# Patient Record
Sex: Female | Born: 1955 | Race: White | Hispanic: No | Smoking: Former smoker
Health system: Southern US, Community
[De-identification: ages and names within clinical notes are randomized; demographics above are authoritative.]

## PROBLEM LIST (undated history)

## (undated) DIAGNOSIS — E079 Disorder of thyroid, unspecified: Secondary | ICD-10-CM

## (undated) DIAGNOSIS — I82409 Acute embolism and thrombosis of unspecified deep veins of unspecified lower extremity: Secondary | ICD-10-CM

## (undated) DIAGNOSIS — E785 Hyperlipidemia, unspecified: Secondary | ICD-10-CM

## (undated) DIAGNOSIS — R011 Cardiac murmur, unspecified: Secondary | ICD-10-CM

## (undated) DIAGNOSIS — H269 Unspecified cataract: Secondary | ICD-10-CM

## (undated) DIAGNOSIS — E039 Hypothyroidism, unspecified: Secondary | ICD-10-CM

## (undated) DIAGNOSIS — K589 Irritable bowel syndrome without diarrhea: Secondary | ICD-10-CM

## (undated) DIAGNOSIS — F419 Anxiety disorder, unspecified: Secondary | ICD-10-CM

## (undated) DIAGNOSIS — M1990 Unspecified osteoarthritis, unspecified site: Secondary | ICD-10-CM

## (undated) DIAGNOSIS — T7840XA Allergy, unspecified, initial encounter: Secondary | ICD-10-CM

## (undated) HISTORY — DX: Unspecified cataract: H26.9

## (undated) HISTORY — PX: TOE SURGERY: SHX1073

## (undated) HISTORY — PX: TRIGGER FINGER RELEASE: SHX641

## (undated) HISTORY — PX: JOINT REPLACEMENT: SHX530

## (undated) HISTORY — PX: HERNIA REPAIR: SHX51

## (undated) HISTORY — PX: TUBAL LIGATION: SHX77

## (undated) HISTORY — PX: COLON SURGERY: SHX602

## (undated) HISTORY — DX: Allergy, unspecified, initial encounter: T78.40XA

## (undated) HISTORY — PX: CARPAL TUNNEL WITH CUBITAL TUNNEL: SHX5608

## (undated) HISTORY — PX: SPINE SURGERY: SHX786

## (undated) HISTORY — DX: Irritable bowel syndrome, unspecified: K58.9

## (undated) HISTORY — PX: AUGMENTATION MAMMAPLASTY: SUR837

## (undated) HISTORY — PX: HARDWARE REMOVAL: SHX979

## (undated) HISTORY — DX: Anxiety disorder, unspecified: F41.9

## (undated) HISTORY — DX: Hyperlipidemia, unspecified: E78.5

## (undated) HISTORY — DX: Disorder of thyroid, unspecified: E07.9

## (undated) HISTORY — PX: DE QUERVAIN'S RELEASE: SHX1439

---
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
## 1974-04-06 HISTORY — PX: PARTIAL HYSTERECTOMY: SHX80

## 1981-04-06 HISTORY — PX: ABDOMINAL HYSTERECTOMY: SHX81

## 2000-04-06 HISTORY — PX: BACK SURGERY: SHX140

## 2003-04-07 HISTORY — PX: BREAST ENHANCEMENT SURGERY: SHX7

## 2007-04-07 HISTORY — PX: REPLACEMENT TOTAL KNEE: SUR1224

## 2008-04-06 HISTORY — PX: CARPAL TUNNEL RELEASE: SHX101

## 2010-04-06 HISTORY — PX: GANGLION CYST EXCISION: SHX1691

## 2011-04-07 HISTORY — PX: ELBOW SURGERY: SHX618

## 2013-09-04 LAB — HM MAMMOGRAPHY

## 2013-11-16 LAB — LIPID PANEL
CHOLESTEROL: 254 mg/dL — AB (ref 0–200)
HDL: 69 mg/dL (ref 35–70)
LDL Cholesterol: 167 mg/dL
Triglycerides: 86 mg/dL (ref 40–160)

## 2013-11-16 LAB — CBC AND DIFFERENTIAL
HCT: 46 % (ref 36–46)
Hemoglobin: 14.6 g/dL (ref 12.0–16.0)
Platelets: 301 10*3/uL (ref 150–399)
WBC: 5.6 10^3/mL

## 2013-11-16 LAB — TSH: TSH: 21.35 u[IU]/mL — AB (ref 0.41–5.90)

## 2013-12-13 LAB — BASIC METABOLIC PANEL
BUN: 11 mg/dL (ref 4–21)
Creatinine: 1 mg/dL (ref 0.5–1.1)
Glucose: 89 mg/dL
POTASSIUM: 3.7 mmol/L (ref 3.4–5.3)
Sodium: 134 mmol/L — AB (ref 137–147)

## 2013-12-13 LAB — TSH: TSH: 2.87 u[IU]/mL (ref 0.41–5.90)

## 2013-12-13 LAB — HEPATIC FUNCTION PANEL
ALT: 31 U/L (ref 7–35)
AST: 26 U/L (ref 13–35)
Alkaline Phosphatase: 78 U/L (ref 25–125)

## 2014-01-05 LAB — TSH: TSH: 0.93 u[IU]/mL (ref 0.41–5.90)

## 2014-03-08 LAB — TSH: TSH: 16.02 u[IU]/mL — AB (ref 0.41–5.90)

## 2014-04-09 ENCOUNTER — Encounter: Payer: Self-pay | Admitting: Physician Assistant

## 2014-04-09 ENCOUNTER — Ambulatory Visit (INDEPENDENT_AMBULATORY_CARE_PROVIDER_SITE_OTHER): Payer: Self-pay | Admitting: Physician Assistant

## 2014-04-09 VITALS — BP 121/76 | HR 55 | Ht 63.5 in | Wt 132.0 lb

## 2014-04-09 DIAGNOSIS — E039 Hypothyroidism, unspecified: Secondary | ICD-10-CM

## 2014-04-09 DIAGNOSIS — M5126 Other intervertebral disc displacement, lumbar region: Secondary | ICD-10-CM

## 2014-04-09 DIAGNOSIS — M5442 Lumbago with sciatica, left side: Secondary | ICD-10-CM

## 2014-04-09 DIAGNOSIS — M5416 Radiculopathy, lumbar region: Secondary | ICD-10-CM

## 2014-04-09 MED ORDER — MELOXICAM 15 MG PO TABS: 15.0000 mg | ORAL_TABLET | Freq: Every day | ORAL | Status: DC

## 2014-04-09 MED ORDER — PREDNISONE 50 MG PO TABS: ORAL_TABLET | ORAL | Status: DC

## 2014-04-09 MED ORDER — LEVOTHYROXINE SODIUM 75 MCG PO CAPS: 75.0000 ug | ORAL_CAPSULE | Freq: Every day | ORAL | Status: DC

## 2014-04-09 MED ORDER — KETOROLAC TROMETHAMINE 30 MG/ML IJ SOLN
30.0000 mg | Freq: Once | INTRAMUSCULAR | Status: AC
  Administered 2014-04-09: 30 mg via INTRAMUSCULAR

## 2014-04-09 NOTE — Progress Notes (Addendum)
   Subjective:    Patient ID: Joy Golden, female    DOB: 1955/05/19, 59 y.o.   MRN: [id number]  HPI  Pt is a 59 yo female who presents to the clinic to establish care.   .. Active Ambulatory Problems    Diagnosis Date Noted  . Thyroid activity decreased 04/11/2014  . Lumbar radiculopathy 04/11/2014   Resolved Ambulatory Problems    Diagnosis Date Noted  . No Resolved Ambulatory Problems   Past Medical History  Diagnosis Date  . Thyroid disease    .Marland Kitchen Family History  Problem Relation Age of Onset  . Alzheimer's disease Mother   . Cancer Sister     ovarian  . Tuberculosis Sister   . Diabetes Maternal Grandmother   . Cancer Sister     ovarian   .Marland Kitchen History   Social History  . Marital Status: Divorced    Spouse Name: N/A    Number of Children: N/A  . Years of Education: N/A   Occupational History  . Not on file.   Social History Main Topics  . Smoking status: Former Research scientist (life sciences)  . Smokeless tobacco: Not on file  . Alcohol Use: 0.0 oz/week    0 Not specified per week  . Drug Use: No  . Sexual Activity: No   Other Topics Concern  . Not on file   Social History Narrative    hypothyriodism- last recheck 03/08/14 and 16.020. Needs to be rechecked. Increased dose of levothyroxine to 60mcg at last visit. Denies any concerns or complications.   Pt has worsening ongoing back pain for last 6 months. For last month pain is 6/10. Pain resolved after surgery in 2002 and came back over last month. No known injury. Pain radiates down left side and into calf. Pt has tried PT in past with no results. Never tried epidural injections. Ibuprofen of little relief. Denies any bowel or bladder dysfunction or saddle anthesthesia.   Review of Systems  All other systems reviewed and are negative.      Objective:   Physical Exam  Constitutional: She is oriented to person, place, and time. She appears well-developed and well-nourished.  HENT:  Head: Normocephalic and atraumatic.   Cardiovascular: Normal rate, regular rhythm and normal heart sounds.   Pulmonary/Chest: Effort normal and breath sounds normal. She has no wheezes.  Musculoskeletal:  Tenderness with palpation over lumbar spine more over L4 and 5 and to the left.  Positive straight leg test on left side with radiation of pain into left calf.  Strength 5/5 left leg.   Neurological: She is alert and oriented to person, place, and time.  Psychiatric: She has a normal mood and affect. Her behavior is normal.          Assessment & Plan:  Hypothyroidism- will recheck in 4 weeks. Given lab slip. Will adjust medications accordingly.   Lumbar radiculopathy/hx of lumbar disc herniation- will need MRI pt does not have insurance until the beginning of febuary. Will order and have follow up with Dr. Darene Lamer. In meantime given toradol 30mg  IM, prednisone, and mobic. Does not have insurance and declined PT at this time. Never had epidural injections may be a candidate. Discussed red flags symptoms that pt instructed to call with.

## 2014-04-09 NOTE — Patient Instructions (Signed)
Sciatica Sciatica is pain, weakness, numbness, or tingling along the path of the sciatic nerve. The nerve starts in the lower back and runs down the back of each leg. The nerve controls the muscles in the lower leg and in the back of the knee, while also providing sensation to the back of the thigh, lower leg, and the sole of your foot. Sciatica is a symptom of another medical condition. For instance, nerve damage or certain conditions, such as a herniated disk or bone spur on the spine, pinch or put pressure on the sciatic nerve. This causes the pain, weakness, or other sensations normally associated with sciatica. Generally, sciatica only affects one side of the body. CAUSES   Herniated or slipped disc.  Degenerative disk disease.  A pain disorder involving the narrow muscle in the buttocks (piriformis syndrome).  Pelvic injury or fracture.  Pregnancy.  Tumor (rare). SYMPTOMS  Symptoms can vary from mild to very severe. The symptoms usually travel from the low back to the buttocks and down the back of the leg. Symptoms can include:  Mild tingling or dull aches in the lower back, leg, or hip.  Numbness in the back of the calf or sole of the foot.  Burning sensations in the lower back, leg, or hip.  Sharp pains in the lower back, leg, or hip.  Leg weakness.  Severe back pain inhibiting movement. These symptoms may get worse with coughing, sneezing, laughing, or prolonged sitting or standing. Also, being overweight may worsen symptoms. DIAGNOSIS  Your caregiver will perform a physical exam to look for common symptoms of sciatica. He or she may ask you to do certain movements or activities that would trigger sciatic nerve pain. Other tests may be performed to find the cause of the sciatica. These may include:  Blood tests.  X-rays.  Imaging tests, such as an MRI or CT scan. TREATMENT  Treatment is directed at the cause of the sciatic pain. Sometimes, treatment is not necessary  and the pain and discomfort goes away on its own. If treatment is needed, your caregiver may suggest:  Over-the-counter medicines to relieve pain.  Prescription medicines, such as anti-inflammatory medicine, muscle relaxants, or narcotics.  Applying heat or ice to the painful area.  Steroid injections to lessen pain, irritation, and inflammation around the nerve.  Reducing activity during periods of pain.  Exercising and stretching to strengthen your abdomen and improve flexibility of your spine. Your caregiver may suggest losing weight if the extra weight makes the back pain worse.  Physical therapy.  Surgery to eliminate what is pressing or pinching the nerve, such as a bone spur or part of a herniated disk. HOME CARE INSTRUCTIONS   Only take over-the-counter or prescription medicines for pain or discomfort as directed by your caregiver.  Apply ice to the affected area for 20 minutes, 3-4 times a day for the first 48-72 hours. Then try heat in the same way.  Exercise, stretch, or perform your usual activities if these do not aggravate your pain.  Attend physical therapy sessions as directed by your caregiver.  Keep all follow-up appointments as directed by your caregiver.  Do not wear high heels or shoes that do not provide proper support.  Check your mattress to see if it is too soft. A firm mattress may lessen your pain and discomfort. SEEK IMMEDIATE MEDICAL CARE IF:   You lose control of your bowel or bladder (incontinence).  You have increasing weakness in the lower back, pelvis, buttocks,   or legs.  You have redness or swelling of your back.  You have a burning sensation when you urinate.  You have pain that gets worse when you lie down or awakens you at night.  Your pain is worse than you have experienced in the past.  Your pain is lasting longer than 4 weeks.  You are suddenly losing weight without reason. MAKE SURE YOU:  Understand these  instructions.  Will watch your condition.  Will get help right away if you are not doing well or get worse. Document Released: 03/17/2001 Document Revised: 09/22/2011 Document Reviewed: 08/02/2011 ExitCare Patient Information 2015 [employer]. This information is not intended to replace advice given to you by your health care provider. Make sure you discuss any questions you have with your health care provider.  

## 2014-04-10 ENCOUNTER — Encounter: Payer: Self-pay | Admitting: Physician Assistant

## 2014-04-11 DIAGNOSIS — M5416 Radiculopathy, lumbar region: Secondary | ICD-10-CM | POA: Insufficient documentation

## 2014-04-11 DIAGNOSIS — M5126 Other intervertebral disc displacement, lumbar region: Secondary | ICD-10-CM | POA: Insufficient documentation

## 2014-04-11 DIAGNOSIS — E039 Hypothyroidism, unspecified: Secondary | ICD-10-CM | POA: Insufficient documentation

## 2014-04-11 DIAGNOSIS — M5442 Lumbago with sciatica, left side: Secondary | ICD-10-CM | POA: Insufficient documentation

## 2014-04-23 ENCOUNTER — Ambulatory Visit (INDEPENDENT_AMBULATORY_CARE_PROVIDER_SITE_OTHER): Payer: Self-pay | Admitting: Physician Assistant

## 2014-04-23 ENCOUNTER — Encounter: Payer: Self-pay | Admitting: Physician Assistant

## 2014-04-23 VITALS — BP 109/67 | HR 50 | Ht 63.0 in | Wt 135.0 lb

## 2014-04-23 DIAGNOSIS — M5126 Other intervertebral disc displacement, lumbar region: Secondary | ICD-10-CM

## 2014-04-23 DIAGNOSIS — M5442 Lumbago with sciatica, left side: Secondary | ICD-10-CM

## 2014-04-23 DIAGNOSIS — T50905A Adverse effect of unspecified drugs, medicaments and biological substances, initial encounter: Secondary | ICD-10-CM

## 2014-04-23 DIAGNOSIS — M5416 Radiculopathy, lumbar region: Secondary | ICD-10-CM

## 2014-04-23 DIAGNOSIS — T887XXA Unspecified adverse effect of drug or medicament, initial encounter: Secondary | ICD-10-CM

## 2014-04-23 NOTE — Patient Instructions (Signed)
Aquafor/vasaline/moisturize eyes.  Benadryl at bedtime for next couple of nights.   Try diclofenac for pain.

## 2014-04-23 NOTE — Progress Notes (Signed)
   Subjective:    Patient ID: Joy Golden, female    DOB: 12-05-1955, 59 y.o.   MRN: [id number]  HPI Patient is a 59 year old female who presents to the clinic with bilateral red and irritated scaly eyelids that are slightly swollen. She noticed this reaction 3 days after starting Mobic for her back pain. She tolerated the prednisone as well as the Toradol shot but when she started the Mobic her eyelid started to react. She stopped mobic and have started to improve. No vision changes. Minimal itchy and some residual burning and swelling noted. Not taken anything to make better except to stop mobic. She is unaware of any other NSAIDs in the past she has responded this way to. She does admit it was helping with her lower back pain.   Review of Systems  All other systems reviewed and are negative.      Objective:   Physical Exam  Constitutional: She appears well-developed and well-nourished.  HENT:  Bilateral eyelids were slightly rough with some minimal swelling, erythema and scaling.   Eyes: Conjunctivae and EOM are normal. Pupils are equal, round, and reactive to light. Right eye exhibits no discharge. Left eye exhibits no discharge.          Assessment & Plan:  Medication reaction- I did place mobic on allergy list. Consider benadryl and aquafor for eye lubriant. Reassured no vision changes. She has diclofenac at her house consider that up to twice a day.   Left sided lwo back pain with sciatica- waiting on MRI approval due to new insurance. Discussed giving imaging downstairs a call for scheduling questions. Continue diclofenac. She declined flexeril. She does not want pain medication. Will wait for MRI and appt with Dr. Darene Lamer.

## 2014-05-07 ENCOUNTER — Encounter: Payer: Self-pay | Admitting: Physician Assistant

## 2014-05-08 LAB — TSH: TSH: 1.362 u[IU]/mL (ref 0.350–4.500)

## 2014-05-08 LAB — T4, FREE: Free T4: 1.47 ng/dL (ref 0.80–1.80)

## 2014-05-09 ENCOUNTER — Telehealth: Payer: Self-pay | Admitting: Physician Assistant

## 2014-05-09 NOTE — Telephone Encounter (Signed)
Put order in for MRI ([id number]) through [employer].  [employer] request physician-to-physician discussion.  Printed off paper and put in Jade's InBox for her to call to discuss.  Case # [id number], call [phone] and hit option #3.  Call must be made in 3 business days.  WB

## 2014-05-10 ENCOUNTER — Other Ambulatory Visit: Payer: Self-pay | Admitting: *Deleted

## 2014-05-10 MED ORDER — LEVOTHYROXINE SODIUM 75 MCG PO CAPS: 75.0000 ug | ORAL_CAPSULE | Freq: Every day | ORAL | Status: DC

## 2014-05-11 NOTE — Telephone Encounter (Signed)
auth obtained- [id number]

## 2014-05-14 ENCOUNTER — Ambulatory Visit (INDEPENDENT_AMBULATORY_CARE_PROVIDER_SITE_OTHER): Payer: 59

## 2014-05-14 DIAGNOSIS — M5416 Radiculopathy, lumbar region: Secondary | ICD-10-CM

## 2014-05-14 DIAGNOSIS — M5126 Other intervertebral disc displacement, lumbar region: Secondary | ICD-10-CM

## 2014-05-14 DIAGNOSIS — M5442 Lumbago with sciatica, left side: Secondary | ICD-10-CM

## 2014-05-14 DIAGNOSIS — M4697 Unspecified inflammatory spondylopathy, lumbosacral region: Secondary | ICD-10-CM

## 2014-05-14 DIAGNOSIS — Z9889 Other specified postprocedural states: Secondary | ICD-10-CM

## 2014-05-14 DIAGNOSIS — M5127 Other intervertebral disc displacement, lumbosacral region: Secondary | ICD-10-CM

## 2014-05-14 DIAGNOSIS — M4807 Spinal stenosis, lumbosacral region: Secondary | ICD-10-CM

## 2014-05-14 DIAGNOSIS — M4696 Unspecified inflammatory spondylopathy, lumbar region: Secondary | ICD-10-CM

## 2014-05-15 ENCOUNTER — Encounter: Payer: Self-pay | Admitting: Physician Assistant

## 2014-05-16 ENCOUNTER — Telehealth: Payer: Self-pay

## 2014-05-16 NOTE — Telephone Encounter (Signed)
Sent PA through cover my meds waiting on Auth. - CF

## 2014-05-17 ENCOUNTER — Telehealth: Payer: Self-pay | Admitting: *Deleted

## 2014-05-17 DIAGNOSIS — M5416 Radiculopathy, lumbar region: Secondary | ICD-10-CM

## 2014-05-17 NOTE — Telephone Encounter (Signed)
Referral placed to orthopedic surgery °

## 2014-05-18 ENCOUNTER — Other Ambulatory Visit: Payer: Self-pay | Admitting: *Deleted

## 2014-05-18 ENCOUNTER — Telehealth: Payer: Self-pay

## 2014-05-18 ENCOUNTER — Institutional Professional Consult (permissible substitution): Payer: Self-pay | Admitting: Sports Medicine

## 2014-05-18 MED ORDER — LEVOTHYROXINE SODIUM 75 MCG PO TABS: 75.0000 ug | ORAL_TABLET | Freq: Every day | ORAL | Status: DC

## 2014-05-18 NOTE — Telephone Encounter (Signed)
I called [employer] and resubmitted auth for Tirosint they will fax over determination. - CF

## 2014-05-18 NOTE — Telephone Encounter (Signed)
I called to check on the status of the PA and it was approved. Coverage until 05/19/2015. Pharmacy notified and medication was approved. The cost is $112 for patient. Left message on patient's voicemail.

## 2014-05-30 ENCOUNTER — Other Ambulatory Visit: Payer: Self-pay | Admitting: Orthopedic Surgery

## 2014-05-30 DIAGNOSIS — M533 Sacrococcygeal disorders, not elsewhere classified: Principal | ICD-10-CM

## 2014-05-30 DIAGNOSIS — G8929 Other chronic pain: Secondary | ICD-10-CM

## 2014-05-31 ENCOUNTER — Other Ambulatory Visit: Payer: Self-pay | Admitting: Orthopedic Surgery

## 2014-05-31 DIAGNOSIS — M533 Sacrococcygeal disorders, not elsewhere classified: Principal | ICD-10-CM

## 2014-05-31 DIAGNOSIS — G8929 Other chronic pain: Secondary | ICD-10-CM

## 2014-06-04 ENCOUNTER — Other Ambulatory Visit: Payer: 59

## 2014-06-05 ENCOUNTER — Other Ambulatory Visit: Payer: 59

## 2014-06-05 ENCOUNTER — Ambulatory Visit
Admission: RE | Admit: 2014-06-05 | Discharge: 2014-06-05 | Disposition: A | Discharge status: Home / Self Care | Payer: 59 | Source: Ambulatory Visit | Attending: Orthopedic Surgery | Admitting: Orthopedic Surgery

## 2014-06-05 DIAGNOSIS — M533 Sacrococcygeal disorders, not elsewhere classified: Principal | ICD-10-CM

## 2014-06-05 DIAGNOSIS — G8929 Other chronic pain: Secondary | ICD-10-CM

## 2014-06-11 ENCOUNTER — Other Ambulatory Visit: Payer: Self-pay | Admitting: Orthopedic Surgery

## 2014-06-18 ENCOUNTER — Encounter (HOSPITAL_COMMUNITY): Payer: Self-pay | Admitting: *Deleted

## 2014-06-18 MED ORDER — POVIDONE-IODINE 7.5 % EX SOLN
Freq: Once | CUTANEOUS | Status: DC
  Filled 2014-06-18: qty 118

## 2014-06-18 MED ORDER — CEFAZOLIN SODIUM-DEXTROSE 2-3 GM-% IV SOLR
2.0000 g | INTRAVENOUS | Status: AC
  Administered 2014-06-19: 2 g via INTRAVENOUS
  Filled 2014-06-18: qty 50

## 2014-06-19 ENCOUNTER — Ambulatory Visit (HOSPITAL_COMMUNITY): Payer: 59

## 2014-06-19 ENCOUNTER — Ambulatory Visit (HOSPITAL_COMMUNITY)
Admission: RE | Admit: 2014-06-19 | Discharge: 2014-06-19 | Disposition: A | Discharge status: Home / Self Care | Payer: 59 | Source: Ambulatory Visit | Attending: Orthopedic Surgery | Admitting: Orthopedic Surgery

## 2014-06-19 ENCOUNTER — Encounter: Payer: Self-pay | Admitting: Physician Assistant

## 2014-06-19 ENCOUNTER — Ambulatory Visit (HOSPITAL_COMMUNITY): Payer: 59 | Admitting: Anesthesiology

## 2014-06-19 ENCOUNTER — Encounter (HOSPITAL_COMMUNITY): Payer: Self-pay | Admitting: *Deleted

## 2014-06-19 ENCOUNTER — Encounter (HOSPITAL_COMMUNITY)
Admission: RE | Disposition: A | Discharge status: Home / Self Care | Payer: Self-pay | Source: Ambulatory Visit | Attending: Orthopedic Surgery

## 2014-06-19 DIAGNOSIS — Z79899 Other long term (current) drug therapy: Secondary | ICD-10-CM | POA: Insufficient documentation

## 2014-06-19 DIAGNOSIS — Z01818 Encounter for other preprocedural examination: Secondary | ICD-10-CM

## 2014-06-19 DIAGNOSIS — M1990 Unspecified osteoarthritis, unspecified site: Secondary | ICD-10-CM | POA: Diagnosis not present

## 2014-06-19 DIAGNOSIS — Z90710 Acquired absence of both cervix and uterus: Secondary | ICD-10-CM | POA: Insufficient documentation

## 2014-06-19 DIAGNOSIS — Z87891 Personal history of nicotine dependence: Secondary | ICD-10-CM | POA: Diagnosis not present

## 2014-06-19 DIAGNOSIS — E039 Hypothyroidism, unspecified: Secondary | ICD-10-CM | POA: Diagnosis not present

## 2014-06-19 DIAGNOSIS — Z419 Encounter for procedure for purposes other than remedying health state, unspecified: Secondary | ICD-10-CM

## 2014-06-19 DIAGNOSIS — E079 Disorder of thyroid, unspecified: Secondary | ICD-10-CM | POA: Insufficient documentation

## 2014-06-19 DIAGNOSIS — M533 Sacrococcygeal disorders, not elsewhere classified: Secondary | ICD-10-CM | POA: Insufficient documentation

## 2014-06-19 HISTORY — DX: Cardiac murmur, unspecified: R01.1

## 2014-06-19 HISTORY — DX: Unspecified osteoarthritis, unspecified site: M19.90

## 2014-06-19 HISTORY — PX: SACROILIAC JOINT FUSION: SHX6088

## 2014-06-19 HISTORY — DX: Hypothyroidism, unspecified: E03.9

## 2014-06-19 LAB — CBC WITH DIFFERENTIAL/PLATELET
Basophils Absolute: 0.1 10*3/uL (ref 0.0–0.1)
Basophils Relative: 1 % (ref 0–1)
Eosinophils Absolute: 0.1 10*3/uL (ref 0.0–0.7)
Eosinophils Relative: 2 % (ref 0–5)
HEMATOCRIT: 43.6 % (ref 36.0–46.0)
Hemoglobin: 14.9 g/dL (ref 12.0–15.0)
LYMPHS PCT: 36 % (ref 12–46)
Lymphs Abs: 2.4 10*3/uL (ref 0.7–4.0)
MCH: 31.4 pg (ref 26.0–34.0)
MCHC: 34.2 g/dL (ref 30.0–36.0)
MCV: 91.8 fL (ref 78.0–100.0)
MONO ABS: 0.4 10*3/uL (ref 0.1–1.0)
Monocytes Relative: 6 % (ref 3–12)
Neutro Abs: 3.5 10*3/uL (ref 1.7–7.7)
Neutrophils Relative %: 55 % (ref 43–77)
PLATELETS: 335 10*3/uL (ref 150–400)
RBC: 4.75 MIL/uL (ref 3.87–5.11)
RDW: 12.8 % (ref 11.5–15.5)
WBC: 6.5 10*3/uL (ref 4.0–10.5)

## 2014-06-19 LAB — COMPREHENSIVE METABOLIC PANEL
ALBUMIN: 4.3 g/dL (ref 3.5–5.2)
ALT: 17 U/L (ref 0–35)
AST: 23 U/L (ref 0–37)
Alkaline Phosphatase: 70 U/L (ref 39–117)
Anion gap: 8 (ref 5–15)
BILIRUBIN TOTAL: 0.9 mg/dL (ref 0.3–1.2)
BUN: 9 mg/dL (ref 6–23)
CHLORIDE: 103 mmol/L (ref 96–112)
CO2: 28 mmol/L (ref 19–32)
CREATININE: 0.8 mg/dL (ref 0.50–1.10)
Calcium: 9.7 mg/dL (ref 8.4–10.5)
GFR calc Af Amer: >90 mL/min (ref >90)
GFR calc non Af Amer: 79 mL/min — ABNORMAL LOW (ref >90)
Glucose, Bld: 112 mg/dL — ABNORMAL HIGH (ref 70–99)
POTASSIUM: 3.4 mmol/L — AB (ref 3.5–5.1)
Sodium: 139 mmol/L (ref 135–145)
Total Protein: 6.9 g/dL (ref 6.0–8.3)

## 2014-06-19 LAB — TYPE AND SCREEN
ABO/RH(D): A POS
Antibody Screen: NEGATIVE

## 2014-06-19 LAB — ABO/RH: ABO/RH(D): A POS

## 2014-06-19 LAB — APTT: aPTT: 28 seconds (ref 24–37)

## 2014-06-19 LAB — PROTIME-INR
INR: 0.98 (ref 0.00–1.49)
PROTHROMBIN TIME: 13.1 s (ref 11.6–15.2)

## 2014-06-19 SURGERY — SACROILIAC JOINT FUSION
Anesthesia: General | Laterality: Left

## 2014-06-19 MED ORDER — METHYLENE BLUE 1 % INJ SOLN
INTRAMUSCULAR | Status: AC
  Filled 2014-06-19: qty 10

## 2014-06-19 MED ORDER — BUPIVACAINE-EPINEPHRINE (PF) 0.25% -1:200000 IJ SOLN
INTRAMUSCULAR | Status: AC
  Filled 2014-06-19: qty 30

## 2014-06-19 MED ORDER — ROCURONIUM BROMIDE 50 MG/5ML IV SOLN
INTRAVENOUS | Status: AC
  Filled 2014-06-19: qty 1

## 2014-06-19 MED ORDER — STERILE WATER FOR INJECTION IJ SOLN
INTRAMUSCULAR | Status: AC
  Filled 2014-06-19: qty 10

## 2014-06-19 MED ORDER — METHYLPREDNISOLONE ACETATE 40 MG/ML IJ SUSP
INTRAMUSCULAR | Status: AC
  Filled 2014-06-19: qty 1

## 2014-06-19 MED ORDER — NEOSTIGMINE METHYLSULFATE 10 MG/10ML IV SOLN
INTRAVENOUS | Status: AC
  Filled 2014-06-19: qty 1

## 2014-06-19 MED ORDER — PROPOFOL 10 MG/ML IV BOLUS
INTRAVENOUS | Status: AC
  Filled 2014-06-19: qty 20

## 2014-06-19 MED ORDER — MIDAZOLAM HCL 2 MG/2ML IJ SOLN
INTRAMUSCULAR | Status: AC
  Filled 2014-06-19: qty 2

## 2014-06-19 MED ORDER — NEOSTIGMINE METHYLSULFATE 10 MG/10ML IV SOLN
INTRAVENOUS | Status: DC | PRN
  Administered 2014-06-19: 4 mg via INTRAVENOUS

## 2014-06-19 MED ORDER — LACTATED RINGERS IV SOLN
INTRAVENOUS | Status: DC | PRN
  Administered 2014-06-19 (×2): via INTRAVENOUS

## 2014-06-19 MED ORDER — PROMETHAZINE HCL 25 MG/ML IJ SOLN: 6.2500 mg | INTRAMUSCULAR | Status: DC | PRN

## 2014-06-19 MED ORDER — FENTANYL CITRATE 0.05 MG/ML IJ SOLN
INTRAMUSCULAR | Status: AC
  Filled 2014-06-19: qty 5

## 2014-06-19 MED ORDER — ROCURONIUM BROMIDE 100 MG/10ML IV SOLN
INTRAVENOUS | Status: DC | PRN
  Administered 2014-06-19: 10 mg via INTRAVENOUS
  Administered 2014-06-19: 20 mg via INTRAVENOUS

## 2014-06-19 MED ORDER — BUPIVACAINE-EPINEPHRINE (PF) 0.25% -1:200000 IJ SOLN
INTRAMUSCULAR | Status: DC | PRN
  Administered 2014-06-19: 4 mL

## 2014-06-19 MED ORDER — 0.9 % SODIUM CHLORIDE (POUR BTL) OPTIME
TOPICAL | Status: DC | PRN
  Administered 2014-06-19: 1000 mL

## 2014-06-19 MED ORDER — ONDANSETRON HCL 4 MG/2ML IJ SOLN
INTRAMUSCULAR | Status: DC | PRN
  Administered 2014-06-19: 4 mg via INTRAVENOUS

## 2014-06-19 MED ORDER — HYDROMORPHONE HCL 1 MG/ML IJ SOLN
INTRAMUSCULAR | Status: AC
  Filled 2014-06-19: qty 1

## 2014-06-19 MED ORDER — LACTATED RINGERS IV SOLN: INTRAVENOUS | Status: DC

## 2014-06-19 MED ORDER — GLYCOPYRROLATE 0.2 MG/ML IJ SOLN
INTRAMUSCULAR | Status: AC
  Filled 2014-06-19: qty 3

## 2014-06-19 MED ORDER — FENTANYL CITRATE 0.05 MG/ML IJ SOLN
INTRAMUSCULAR | Status: DC | PRN
  Administered 2014-06-19: 50 ug via INTRAVENOUS

## 2014-06-19 MED ORDER — ONDANSETRON HCL 4 MG/2ML IJ SOLN
INTRAMUSCULAR | Status: AC
  Filled 2014-06-19: qty 2

## 2014-06-19 MED ORDER — PROPOFOL 10 MG/ML IV BOLUS
INTRAVENOUS | Status: DC | PRN
  Administered 2014-06-19: 30 mg via INTRAVENOUS
  Administered 2014-06-19: 170 mg via INTRAVENOUS

## 2014-06-19 MED ORDER — LIDOCAINE HCL (CARDIAC) 20 MG/ML IV SOLN
INTRAVENOUS | Status: AC
  Filled 2014-06-19: qty 5

## 2014-06-19 MED ORDER — THROMBIN 20000 UNITS EX SOLR
CUTANEOUS | Status: AC
  Filled 2014-06-19: qty 20000

## 2014-06-19 MED ORDER — LIDOCAINE HCL (CARDIAC) 20 MG/ML IV SOLN
INTRAVENOUS | Status: DC | PRN
  Administered 2014-06-19: 75 mg via INTRAVENOUS
  Administered 2014-06-19: 50 mg via INTRAVENOUS

## 2014-06-19 MED ORDER — GLYCOPYRROLATE 0.2 MG/ML IJ SOLN
INTRAMUSCULAR | Status: DC | PRN
  Administered 2014-06-19: .7 mg via INTRAVENOUS
  Administered 2014-06-19: 0.2 mg via INTRAVENOUS

## 2014-06-19 MED ORDER — LACTATED RINGERS IV SOLN
INTRAVENOUS | Status: DC
  Administered 2014-06-19: 12:00:00 via INTRAVENOUS

## 2014-06-19 MED ORDER — HYDROMORPHONE HCL 1 MG/ML IJ SOLN
0.2500 mg | INTRAMUSCULAR | Status: DC | PRN
  Administered 2014-06-19 (×2): 0.5 mg via INTRAVENOUS

## 2014-06-19 MED ORDER — MIDAZOLAM HCL 5 MG/5ML IJ SOLN
INTRAMUSCULAR | Status: DC | PRN
  Administered 2014-06-19 (×2): 1 mg via INTRAVENOUS

## 2014-06-19 SURGICAL SUPPLY — 53 items
BENZOIN TINCTURE PRP APPL 2/3 (GAUZE/BANDAGES/DRESSINGS) ×2 IMPLANT
BLADE SURG 10 STRL SS (BLADE) ×2 IMPLANT
BLADE SURG 11 STRL SS (BLADE) ×2 IMPLANT
BLADE SURG ROTATE 9660 (MISCELLANEOUS) IMPLANT
CANISTER SUCTION 2500CC (MISCELLANEOUS) ×2 IMPLANT
CAP-I-FUSE IMPLANT SYSTEM ×2 IMPLANT
COVER SURGICAL LIGHT HANDLE (MISCELLANEOUS) ×2 IMPLANT
DRAPE C-ARM 42X72 X-RAY (DRAPES) ×2 IMPLANT
DRAPE C-ARMOR (DRAPES) ×2 IMPLANT
DRAPE INCISE IOBAN 66X45 STRL (DRAPES) ×2 IMPLANT
DRAPE POUCH INSTRU U-SHP 10X18 (DRAPES) IMPLANT
DRAPE SURG 17X23 STRL (DRAPES) ×8 IMPLANT
DURAPREP 26ML APPLICATOR (WOUND CARE) ×2 IMPLANT
ELECT CAUTERY BLADE 6.4 (BLADE) ×2 IMPLANT
ELECT REM PT RETURN 9FT ADLT (ELECTROSURGICAL) ×2
ELECTRODE REM PT RTRN 9FT ADLT (ELECTROSURGICAL) ×1 IMPLANT
GAUZE SPONGE 4X4 12PLY STRL (GAUZE/BANDAGES/DRESSINGS) ×2 IMPLANT
GAUZE SPONGE 4X4 16PLY XRAY LF (GAUZE/BANDAGES/DRESSINGS) ×2 IMPLANT
GLOVE BIO SURGEON STRL SZ7 (GLOVE) ×2 IMPLANT
GLOVE BIO SURGEON STRL SZ8 (GLOVE) ×2 IMPLANT
GLOVE BIOGEL PI IND STRL 7.0 (GLOVE) ×1 IMPLANT
GLOVE BIOGEL PI IND STRL 8 (GLOVE) ×1 IMPLANT
GLOVE BIOGEL PI INDICATOR 7.0 (GLOVE) ×1
GLOVE BIOGEL PI INDICATOR 8 (GLOVE) ×1
GOWN STRL REUS W/ TWL LRG LVL3 (GOWN DISPOSABLE) ×2 IMPLANT
GOWN STRL REUS W/ TWL XL LVL3 (GOWN DISPOSABLE) ×1 IMPLANT
GOWN STRL REUS W/TWL LRG LVL3 (GOWN DISPOSABLE) ×2
GOWN STRL REUS W/TWL XL LVL3 (GOWN DISPOSABLE) ×1
KIT BASIN OR (CUSTOM PROCEDURE TRAY) ×2 IMPLANT
KIT ROOM TURNOVER OR (KITS) ×2 IMPLANT
MANIFOLD NEPTUNE II (INSTRUMENTS) IMPLANT
NEEDLE 22X1 1/2 (OR ONLY) (NEEDLE) ×2 IMPLANT
NEEDLE HYPO 25GX1X1/2 BEV (NEEDLE) ×2 IMPLANT
NS IRRIG 1000ML POUR BTL (IV SOLUTION) ×2 IMPLANT
PACK UNIVERSAL I (CUSTOM PROCEDURE TRAY) ×2 IMPLANT
PAD ARMBOARD 7.5X6 YLW CONV (MISCELLANEOUS) ×4 IMPLANT
PENCIL BUTTON HOLSTER BLD 10FT (ELECTRODE) IMPLANT
SPONGE LAP 18X18 X RAY DECT (DISPOSABLE) ×2 IMPLANT
STAPLER VISISTAT 35W (STAPLE) ×2 IMPLANT
STRIP CLOSURE SKIN 1/2X4 (GAUZE/BANDAGES/DRESSINGS) ×2 IMPLANT
SUT MNCRL AB 4-0 PS2 18 (SUTURE) ×2 IMPLANT
SUT VIC AB 0 CT1 18XCR BRD 8 (SUTURE) ×1 IMPLANT
SUT VIC AB 0 CT1 8-18 (SUTURE) ×1
SUT VIC AB 1 CT1 18XCR BRD 8 (SUTURE) ×1 IMPLANT
SUT VIC AB 1 CT1 8-18 (SUTURE) ×1
SUT VIC AB 2-0 CT2 18 VCP726D (SUTURE) ×2 IMPLANT
SYR BULB IRRIGATION 50ML (SYRINGE) ×2 IMPLANT
SYR CONTROL 10ML LL (SYRINGE) ×2 IMPLANT
TOWEL OR 17X24 6PK STRL BLUE (TOWEL DISPOSABLE) ×2 IMPLANT
TOWEL OR 17X26 10 PK STRL BLUE (TOWEL DISPOSABLE) ×4 IMPLANT
TUBE CONNECTING 12X1/4 (SUCTIONS) ×2 IMPLANT
WATER STERILE IRR 1000ML POUR (IV SOLUTION) IMPLANT
YANKAUER SUCT BULB TIP NO VENT (SUCTIONS) ×2 IMPLANT

## 2014-06-19 NOTE — H&P (Signed)
PREOPERATIVE H&P  Chief Complaint: left low back pain  HPI: Joy Golden is a 59 y.o. female who presents with ongoing pain in the left low back  Pain was temporarily improved with left SI injection.  Patient has failed multiple forms of conservative care and continues to have pain (see office notes for additional details regarding the patient's full course of treatment)  Past Medical History  Diagnosis Date  . Thyroid disease   . Heart murmur     some say yes, some say no  . Arthritis     Osteoarthritis  . Hypothyroidism    Past Surgical History  Procedure Laterality Date  . Abdominal hysterectomy  1983  . Hernia repair  1970/1980    4 surgeries- abdominal  . Back surgery  2002    L4-L5 laminectomy  . Breast enhancement surgery  2005  . Tonsillectomy  1967  . Carpal tunnel release Right 2010  . Replacement total knee Right 2009  . Ganglion cyst excision Right 2012  . Trigger finger release Right     thumb  . Partial hysterectomy  1976  . Toe surgery Right     great toe horse stepped on it  . Elbow surgery Left 2013  . Carpal tunnel with cubital tunnel Left   . De quervain's release Right    History   Social History  . Marital Status: Divorced    Spouse Name: N/A  . Number of Children: N/A  . Years of Education: N/A   Social History Main Topics  . Smoking status: Former Smoker -- 14 years  . Smokeless tobacco: Not on file     Comment: Quit at age 46.  Marland Kitchen Alcohol Use: 4.2 oz/week    7 Glasses of wine, 0 Standard drinks or equivalent per week  . Drug Use: No  . Sexual Activity: No   Other Topics Concern  . None   Social History Narrative   Family History  Problem Relation Age of Onset  . Alzheimer's disease Mother   . Cancer Sister     ovarian  . Tuberculosis Sister   . Diabetes Maternal Grandmother   . Cancer Sister     ovarian   Allergies  Allergen Reactions  . Mobic [Meloxicam]     Eye irritation   Prior to Admission medications     Medication Sig Start Date End Date Taking? Authorizing Provider  fluticasone (FLONASE) 50 MCG/ACT nasal spray Place 1 spray into both nostrils daily as needed for allergies or rhinitis.   Yes Historical Provider, MD  levothyroxine (SYNTHROID, LEVOTHROID) 75 MCG tablet Take 1 tablet (75 mcg total) by mouth daily. 05/18/14  Yes Jade L Breeback, PA-C  Levothyroxine Sodium (TIROSINT) 75 MCG CAPS Take 1 capsule (75 mcg total) by mouth daily before breakfast. Patient not taking: Reported on 06/15/2014 05/10/14   Marcial Pacas, DO     All other systems have been reviewed and were otherwise negative with the exception of those mentioned in the HPI and as above.  Physical Exam: Filed Vitals:   06/19/14 1116  Pulse: 48  Temp: 98.3 F (36.8 C)  Resp: 18    General: Alert, no acute distress Cardiovascular: No pedal edema Respiratory: No cyanosis, no use of accessory musculature Skin: No lesions in the area of chief complaint Neurologic: Sensation intact distally Psychiatric: Patient is competent for consent with normal mood and affect Lymphatic: No axillary or cervical lymphadenopathy  MUSCULOSKELETAL: + TTP left low back  Assessment/Plan: Left  sided sacroiliac joint dysfunction Plan for Procedure(s): LEFT SACROILIAC JOINT FUSION   Sinclair Ship, MD 06/19/2014 12:22 PM

## 2014-06-19 NOTE — Anesthesia Preprocedure Evaluation (Signed)
Anesthesia Evaluation  Patient identified by MRN, date of birth, ID band  Reviewed: Allergy & Precautions, NPO status , Patient's Chart, lab work & pertinent test results  Airway Mallampati: II  TM Distance: >3 FB Neck ROM: Full    Dental   Pulmonary former smoker,  breath sounds clear to auscultation        Cardiovascular negative cardio ROS  Rhythm:Regular Rate:Normal     Neuro/Psych    GI/Hepatic negative GI ROS, Neg liver ROS,   Endo/Other  Hypothyroidism   Renal/GU      Musculoskeletal   Abdominal   Peds  Hematology   Anesthesia Other Findings   Reproductive/Obstetrics                             Anesthesia Physical Anesthesia Plan  ASA: II  Anesthesia Plan: General   Post-op Pain Management:    Induction: Intravenous  Airway Management Planned: Oral ETT  Additional Equipment:   Intra-op Plan:   Post-operative Plan: Extubation in OR  Informed Consent: I have reviewed the patients History and Physical, chart, labs and discussed the procedure including the risks, benefits and alternatives for the proposed anesthesia with the patient or authorized representative who has indicated his/her understanding and acceptance.   Dental advisory given  Plan Discussed with: CRNA and Anesthesiologist  Anesthesia Plan Comments:         Anesthesia Quick Evaluation

## 2014-06-19 NOTE — Anesthesia Postprocedure Evaluation (Signed)
  Anesthesia Post-op Note  Patient: Joy Golden  Procedure(s) Performed: Procedure(s) with comments: SACROILIAC JOINT FUSION (Left) - Left sided sacroiliac joint fusion  Patient Location: PACU  Anesthesia Type:General  Level of Consciousness: awake  Airway and Oxygen Therapy: Patient Spontanous Breathing  Post-op Pain: mild  Post-op Assessment: Post-op Vital signs reviewed  Post-op Vital Signs: Reviewed  Last Vitals:  Filed Vitals:   06/19/14 1621  BP: 196/55  Pulse: 59  Temp: 36.8 C  Resp: 16    Complications: No apparent anesthesia complications

## 2014-06-19 NOTE — Anesthesia Procedure Notes (Signed)
Procedure Name: Intubation Date/Time: 06/19/2014 2:39 PM Performed by: Scheryl Darter Pre-anesthesia Checklist: Patient identified, Emergency Drugs available, Suction available, Patient being monitored and Timeout performed Patient Re-evaluated:Patient Re-evaluated prior to inductionOxygen Delivery Method: Circle system utilized Preoxygenation: Pre-oxygenation with 100% oxygen Intubation Type: IV induction Ventilation: Mask ventilation without difficulty Grade View: Grade I Tube type: Oral Tube size: 7.5 mm Number of attempts: 1 Airway Equipment and Method: Stylet Placement Confirmation: ETT inserted through vocal cords under direct vision,  positive ETCO2 and breath sounds checked- equal and bilateral Secured at: 22 cm Tube secured with: Tape Dental Injury: Teeth and Oropharynx as per pre-operative assessment

## 2014-06-19 NOTE — Progress Notes (Signed)
Orthopedic Tech Progress Note Patient Details:  Joy Golden 02-05-1956 [id number]  Ortho Devices Type of Ortho Device: Arm sling Ortho Device/Splint Location: LUE Ortho Device/Splint Interventions: Ordered, Application   Braulio Bosch 06/19/2014, 4:26 PM

## 2014-06-19 NOTE — Progress Notes (Signed)
Orthopedic Tech Progress Note Patient Details:  Joy Golden [birth year] [id number]  Ortho Devices Type of Ortho Device: Crutches Ortho Device/Splint Location: LUE Ortho Device/Splint Interventions: Ordered, Adjustment   Braulio Bosch 06/19/2014, 5:53 PM

## 2014-06-19 NOTE — Transfer of Care (Signed)
Immediate Anesthesia Transfer of Care Note  Patient: Joy Golden  Procedure(s) Performed: Procedure(s) with comments: SACROILIAC JOINT FUSION (Left) - Left sided sacroiliac joint fusion  Patient Location: PACU  Anesthesia Type:General  Level of Consciousness: awake, alert  and oriented  Airway & Oxygen Therapy: Patient Spontanous Breathing and Patient connected to nasal cannula oxygen  Post-op Assessment: Report given to RN and Post -op Vital signs reviewed and stable  Post vital signs: Reviewed and stable  Last Vitals:  Filed Vitals:   06/19/14 1116  Pulse: 48  Temp: 36.8 C  Resp: 18    Complications: No apparent anesthesia complications

## 2014-06-19 NOTE — Discharge Instructions (Signed)
°What to eat: ° °For your first meals, you should eat lightly; only small meals initially.  If you do not have nausea, you may eat larger meals.  Avoid spicy, greasy and heavy food.   ° °General Anesthesia, Adult, Care After  °Refer to this sheet in the next few weeks. These instructions provide you with information on caring for yourself after your procedure. Your health care provider may also give you more specific instructions. Your treatment has been planned according to current medical practices, but problems sometimes occur. Call your health care provider if you have any problems or questions after your procedure.  °WHAT TO EXPECT AFTER THE PROCEDURE  °After the procedure, it is typical to experience:  °Sleepiness.  °Nausea and vomiting. °HOME CARE INSTRUCTIONS  °For the first 24 hours after general anesthesia:  °Have a responsible person with you.  °Do not drive a car. If you are alone, do not take public transportation.  °Do not drink alcohol.  °Do not take medicine that has not been prescribed by your health care provider.  °Do not sign important papers or make important decisions.  °You may resume a normal diet and activities as directed by your health care provider.  °Change bandages (dressings) as directed.  °If you have questions or problems that seem related to general anesthesia, call the hospital and ask for the anesthetist or anesthesiologist on call. °SEEK MEDICAL CARE IF:  °You have nausea and vomiting that continue the day after anesthesia.  °You develop a rash. °SEEK IMMEDIATE MEDICAL CARE IF:  °You have difficulty breathing.  °You have chest pain.  °You have any allergic problems. °Document Released: 06/29/2000 Document Revised: 11/23/2012 Document Reviewed: 10/06/2012  °ExitCare® Patient Information ©2014 [employer].  ° °Sore Throat  ° ° °A sore throat is a painful, burning, sore, or scratchy feeling of the throat. There may be pain or tenderness when swallowing or talking. You may have  other symptoms with a sore throat. These include coughing, sneezing, fever, or a swollen neck. A sore throat is often the first sign of another sickness. These sicknesses may include a cold, flu, strep throat, or an infection called mono. Most sore throats go away without medical treatment.  °HOME CARE  °Only take medicine as told by your doctor.  °Drink enough fluids to keep your pee (urine) clear or pale yellow.  °Rest as needed.  °Try using throat sprays, lozenges, or suck on hard candy (if older than 4 years or as told).  °Sip warm liquids, such as broth, herbal tea, or warm water with honey. Try sucking on frozen ice pops or drinking cold liquids.  °Rinse the mouth (gargle) with salt water. Mix 1 teaspoon salt with 8 ounces of water.  °Do not smoke. Avoid being around others when they are smoking.  °Put a humidifier in your bedroom at night to moisten the air. You can also turn on a hot shower and sit in the bathroom for 5-10 minutes. Be sure the bathroom door is closed. °GET HELP RIGHT AWAY IF:  °You have trouble breathing.  °You cannot swallow fluids, soft foods, or your spit (saliva).  °You have more puffiness (swelling) in the throat.  °Your sore throat does not get better in 7 days.  °You feel sick to your stomach (nauseous) and throw up (vomit).  °You have a fever or lasting symptoms for more than 2-3 days.  °You have a fever and your symptoms suddenly get worse. °MAKE SURE YOU:  °Understand these   instructions.  °Will watch your condition.  °Will get help right away if you are not doing well or get worse. °Document Released: 12/31/2007 Document Revised: 12/16/2011 Document Reviewed: 11/29/2011  °ExitCare® Patient Information ©2015 [employer]. This information is not intended to replace advice given to you by your health care provider. Make sure you discuss any questions you have with your health care provider.  ° ° ° °

## 2014-06-20 ENCOUNTER — Encounter (HOSPITAL_COMMUNITY): Payer: Self-pay | Admitting: Orthopedic Surgery

## 2014-06-20 NOTE — Op Note (Signed)
NAMEMAYRE, BURY NO.:  [id number]  MEDICAL RECORD NO.:  [id number]  LOCATION:  [facility]                         FACILITY:  [facility]  PHYSICIAN:  Phylliss Bob, MD      DATE OF BIRTH:  [birth year]  DATE OF PROCEDURE:  06/19/2014 DATE OF DISCHARGE:  06/19/2014                              OPERATIVE REPORT   PREOPERATIVE DIAGNOSIS:  Left sacroiliac joint dysfunction.  POSTOPERATIVE DIAGNOSIS:  Left sacroiliac joint dysfunction.  PROCEDURE:  Left-sided sacroiliac joint fusion.  SURGEON:  Phylliss Bob, MD.  ASSISTANTSPricilla Holm, PA-C.  ANESTHESIA:  General endotracheal anesthesia.  COMPLICATIONS:  None.  DISPOSITION:  Stable.  ESTIMATED BLOOD LOSS:  Minimal.  INDICATIONS FOR SURGERY:  Mr. Polyakov is very pleasant 59 year old female, who did present to me with ongoing pain at the left side of her low back for approximately 3-4 months at the time of my evaluation with her on May 28, 2014.  Her exam and history were consistent with left sacroiliac joint dysfunction.  She did have a left sacroiliac joint injection, which did entirely alleviate her pain, but only temporarily. We therefore did discuss proceeding with the procedure noted above.  The patient did fully understand the risks and limitations of the procedure as outlined in my preoperative note.  Operative details on 06/19/2014, the patient was brought to surgery and general endotracheal anesthesia was administered.  The patient was placed prone on a well-padded flat Jackson bed.  Gel was replaced under the patient's chest and hips.  Antibiotics were given.  The region of the left buttock was prepped and draped in the usual sterile fashion and a time-out was performed.  I then made a 3 cm incision overlying the left sacroiliac joint.  I then advanced 3 guidewires across the sacroiliac joint.  One above the S1 foramen, 1 in line with it, and 1 beneath it.  I then drilled and broached over the  guidewires.  I then advanced the appropriate length implants, 7 mm in diameter, over the guidewires across the sacroiliac joint.  I did liberally use lateral inlet and outlet fluoroscopy, and was very pleased with the final resting position of the implants.  I did note an excellent press-fit of each of the implants.  The guidewires were then  removed.  I then copiously irrigated the wound.  The fascia was closed using #1 Vicryl. The subcutaneous layer was closed using 2-0 Vicryl and the skin was closed using 3-0 Monocryl.  Benzoin and Steri- Strips were applied followed by sterile dressing.  All  instrument counts were correct at the termination of the procedure.  Of note, Pricilla Holm was my assistant throughout surgery, and did aid in retraction, suctioning, and closure.     Phylliss Bob, MD     MD/MEDQ  D:  06/19/2014  T:  06/20/2014  Job:  [id number]  cc:   Beatrice Lecher, M.D.

## 2014-07-16 ENCOUNTER — Encounter: Payer: Self-pay | Admitting: Physician Assistant

## 2014-07-18 ENCOUNTER — Ambulatory Visit (INDEPENDENT_AMBULATORY_CARE_PROVIDER_SITE_OTHER): Payer: 59 | Admitting: Physician Assistant

## 2014-07-18 ENCOUNTER — Encounter: Payer: Self-pay | Admitting: Physician Assistant

## 2014-07-18 VITALS — BP 127/78 | HR 57 | Wt 130.0 lb

## 2014-07-18 DIAGNOSIS — M5442 Lumbago with sciatica, left side: Secondary | ICD-10-CM

## 2014-07-18 DIAGNOSIS — L821 Other seborrheic keratosis: Secondary | ICD-10-CM | POA: Diagnosis not present

## 2014-07-18 DIAGNOSIS — E039 Hypothyroidism, unspecified: Secondary | ICD-10-CM | POA: Diagnosis not present

## 2014-07-18 LAB — COMPLETE METABOLIC PANEL WITH GFR
ALT: 16 U/L (ref 0–35)
AST: 21 U/L (ref 0–37)
Albumin: 4.4 g/dL (ref 3.5–5.2)
Alkaline Phosphatase: 87 U/L (ref 39–117)
BILIRUBIN TOTAL: 0.6 mg/dL (ref 0.2–1.2)
BUN: 7 mg/dL (ref 6–23)
CALCIUM: 9.9 mg/dL (ref 8.4–10.5)
CO2: 29 meq/L (ref 19–32)
Chloride: 102 mEq/L (ref 96–112)
Creat: 0.75 mg/dL (ref 0.50–1.10)
GFR, EST NON AFRICAN AMERICAN: 87 mL/min
GLUCOSE: 75 mg/dL (ref 70–99)
Potassium: 4.6 mEq/L (ref 3.5–5.3)
Sodium: 139 mEq/L (ref 135–145)
Total Protein: 6.9 g/dL (ref 6.0–8.3)

## 2014-07-18 LAB — TSH: TSH: 1.381 u[IU]/mL (ref 0.350–4.500)

## 2014-07-18 NOTE — Progress Notes (Signed)
   Subjective:    Patient ID: Joy Golden, female    DOB: 19-Jun-1955, 59 y.o.   MRN: [id number]  HPI  Pt presents to the clinic wanting SK's removed. She had done once and came back. They are very irritating. She scratches them often and sometimes causes them to bleed. They are on her chest and very easy to get to and she sees them all the time.   She is leaving to go back up [place] for the summer. Needs refills on thyroid medication. No problems or concerns.       Review of Systems  All other systems reviewed and are negative.      Objective:   Physical Exam  Constitutional: She is oriented to person, place, and time. She appears well-developed and well-nourished.  HENT:  Head: Normocephalic and atraumatic.  Neck: Normal range of motion. Neck supple. No thyromegaly present.  Cardiovascular: Normal rate, regular rhythm and normal heart sounds.   Pulmonary/Chest: Effort normal and breath sounds normal.  Neurological: She is alert and oriented to person, place, and time.  Skin:     Psychiatric: She has a normal mood and affect. Her behavior is normal.          Assessment & Plan:  Seborrheic keratosis cryotherapy on chest. Gave HO. Discussed sunscreen use.   Cryotherapy Procedure Note  Pre-operative Diagnosis: seborrheic keratosis  Post-operative Diagnosis: same  Locations: upper chest area 3 separate lesions.   Indications: irritation   Procedure Details  History of allergy to iodine: no. Pacemaker? no.  Patient informed of risks (permanent scarring, infection, light or dark discoloration, bleeding, infection, weakness, numbness and recurrence of the lesion) and benefits of the procedure and verbal informed consent obtained.  The areas are treated with liquid nitrogen therapy, frozen until ice ball extended 2-3 mm beyond lesion, allowed to thaw, and treated again. The patient tolerated procedure well.  The patient was instructed on post-op care, warned that there  may be blister formation, redness and pain. Recommend OTC analgesia as needed for pain.  Condition: Stable  Complications: none.  Plan: 1. Instructed to keep the area dry and covered for 24-48h and clean thereafter. 2. Warning signs of infection were reviewed.   3. Recommended that the patient use OTC acetaminophen as needed for pain.     Hypothyroidism- will check TSH and adjust accordingly. Follow up in 6 month if everything in normal range.    Left sided low back pain- resolved with surgery 06/2014.

## 2014-07-18 NOTE — Patient Instructions (Signed)
Seborrheic Keratosis Seborrheic keratosis is a common, noncancerous (benign) skin growth that can occur anywhere on the skin.It looks like "stuck-on," waxy, rough, tan, brown, or black spots on the skin. These skin growths can be flat or raised.They are often called "barnacles" because of their pasted-on appearance.Usually, these skin growths appear in adulthood, around age 61, and increase in number as you age. They may also develop during pregnancy or following estrogen therapy. Many people may only have one growth appear in their lifetime, while some people may develop many growths. CAUSES It is unknown what causes these skin growths, but they appear to run in families. SYMPTOMS Seborrheic keratosis is often located on the face, chest, shoulders, back, or other areas. These growths are:  Usually painless, but may become irritated and itchy.  Yellow, brown, black, or other colors.  Slightly raised or have a flat surface.  Sometimes rough or wart-like in texture.  Often waxy on the surface.  Round or oval-shaped.  Sometimes "stuck-on" in appearance.  Sometimes single, but there are usually many growths. Any growth that bleeds, itches on a regular basis, becomes inflamed, or becomes irritated needs to be evaluated by a skin specialist (dermatologist). DIAGNOSIS Diagnosis is mainly based on the way the growths appear. In some cases, it can be difficult to tell this type of skin growth from skin cancer. A skin growth tissue sample (biopsy) may be used to confirm the diagnosis. TREATMENT Most often, treatment is not needed because the skin growths are benign.If the skin growth is irritated easily by clothing or jewelry, causing it to scab or bleed, treatment may be recommended. Patients may also choose to have the growths removed because they do not like their appearance. Most commonly, these growths are treated with cryosurgery. In cryosurgery, liquid nitrogen is applied to "freeze" the  growth. The growth usually falls off within a matter of days. A blister may form and dry into a scab that will also fall off. After the growth or scab falls off, it may leave a dark or light spot on the skin. This color may fade over time, or it may remain permanent on the skin. HOME CARE INSTRUCTIONS If the skin growths are treated with cryosurgery, the treated area needs to be kept clean with water and soap. SEEK MEDICAL CARE IF:  You have questions about these growths or other skin problems.  You develop new symptoms, including:  A change in the appearance of the skin growth.  New growths.  Any bleeding, itching, or pain in the growths.  A skin growth that looks similar to seborrheic keratosis. Document Released: 04/25/2010 Document Revised: 06/15/2011 Document Reviewed: 04/25/2010 ExitCare Patient Information 2015 [employer]. This information is not intended to replace advice given to you by your health care provider. Make sure you discuss any questions you have with your health care provider.  

## 2014-07-19 ENCOUNTER — Other Ambulatory Visit: Payer: Self-pay | Admitting: *Deleted

## 2014-07-19 MED ORDER — LEVOTHYROXINE SODIUM 75 MCG PO TABS: 75.0000 ug | ORAL_TABLET | Freq: Every day | ORAL | Status: DC

## 2014-07-23 ENCOUNTER — Encounter: Payer: 59 | Admitting: Physician Assistant

## 2014-08-05 ENCOUNTER — Encounter: Payer: Self-pay | Admitting: Physician Assistant

## 2014-08-06 ENCOUNTER — Ambulatory Visit: Payer: 59 | Admitting: Physician Assistant

## 2014-10-25 ENCOUNTER — Other Ambulatory Visit: Payer: Self-pay | Admitting: Physician Assistant

## 2014-11-06 ENCOUNTER — Encounter: Payer: Self-pay | Admitting: *Deleted

## 2014-11-30 ENCOUNTER — Other Ambulatory Visit: Payer: Self-pay | Admitting: Physician Assistant

## 2015-01-06 ENCOUNTER — Encounter: Payer: Self-pay | Admitting: Physician Assistant

## 2015-01-06 ENCOUNTER — Telehealth: Payer: Self-pay | Admitting: Physician Assistant

## 2015-01-06 DIAGNOSIS — J208 Acute bronchitis due to other specified organisms: Principal | ICD-10-CM

## 2015-01-06 DIAGNOSIS — J00 Acute nasopharyngitis [common cold]: Secondary | ICD-10-CM

## 2015-01-06 DIAGNOSIS — B9689 Other specified bacterial agents as the cause of diseases classified elsewhere: Secondary | ICD-10-CM

## 2015-01-06 NOTE — Progress Notes (Signed)
We are sorry that you are not feeling well.  Here is how we plan to help!  Based on what you have shared with me it looks like you have upper respiratory tract inflammation that has resulted in a significant cough.  Inflammation and infection in the upper respiratory tract is commonly called bronchitis and has four common causes:  Allergies, Viral Infections, Acid Reflux and Bacterial Infections.  Allergies, viruses and acid reflux are treated by controlling symptoms or eliminating the cause. An example might be a cough caused by taking certain blood pressure medications. You stop the cough by changing the medication. Another example might be a cough caused by acid reflux. Controlling the reflux helps control the cough.  Based on your presentation I believe you most likely have A cough due to bacteria.  Please use A non-prescription cough medication called Mucinex DM: take 2 tablets every 12 hours.  I would recommend antibiotics, unfortunately the pharmacy you have listed is out of state and we are not allowed to send medication to a pharmacy outside of [facility] for e-visits.  I am assuming you are currently in [place]. If this is not correct please let me know so that we can change to a local pharmacy and I will send in an antibiotic. Otherwise, you will need to see a provider in [place] or call your primary care provider on Monday.     HOME CARE . Only take medications as instructed by your medical team. . Complete the entire course of an antibiotic. . Drink plenty of fluids and get plenty of rest. . Avoid close contacts especially the very young and the elderly . Cover your mouth if you cough or cough into your sleeve. . Always remember to wash your hands . A steam or ultrasonic humidifier can help congestion.    GET HELP RIGHT AWAY IF: . You develop worsening fever. . You become short of breath . You cough up blood. . Your symptoms persist after you have completed your treatment plan MAKE  SURE YOU   Understand these instructions.  Will watch your condition.  Will get help right away if you are not doing well or get worse.  Your e-visit answers were reviewed by a board certified advanced clinical practitioner to complete your personal care plan.  Depending on the condition, your plan could have included both over the counter or prescription medications. If there is a problem please reply  once you have received a response from your provider. Your safety is important to [place].  If you have drug allergies check your prescription carefully.    You can use MyChart to ask questions about today's visit, request a non-urgent call back, or ask for a work or school excuse for 24 hours related to this e-Visit. If it has been greater than 24 hours you will need to follow up with your provider, or enter a new e-Visit to address those concerns. You will get an e-mail in the next two days asking about your experience.  I hope that your e-visit has been valuable and will speed your recovery. Thank you for using e-visits.

## 2015-01-07 ENCOUNTER — Other Ambulatory Visit: Payer: Self-pay | Admitting: Physician Assistant

## 2015-01-07 ENCOUNTER — Other Ambulatory Visit: Payer: Self-pay | Admitting: *Deleted

## 2015-01-07 MED ORDER — AZITHROMYCIN 250 MG PO TABS: ORAL_TABLET | ORAL | Status: DC

## 2015-01-31 ENCOUNTER — Encounter: Payer: Self-pay | Admitting: Physician Assistant

## 2015-02-01 ENCOUNTER — Ambulatory Visit (INDEPENDENT_AMBULATORY_CARE_PROVIDER_SITE_OTHER): Payer: 59 | Admitting: Osteopathic Medicine

## 2015-02-01 ENCOUNTER — Encounter: Payer: Self-pay | Admitting: Osteopathic Medicine

## 2015-02-01 ENCOUNTER — Other Ambulatory Visit: Payer: Self-pay

## 2015-02-01 ENCOUNTER — Ambulatory Visit (INDEPENDENT_AMBULATORY_CARE_PROVIDER_SITE_OTHER): Payer: 59

## 2015-02-01 VITALS — BP 111/76 | HR 69 | Wt 131.0 lb

## 2015-02-01 DIAGNOSIS — J00 Acute nasopharyngitis [common cold]: Secondary | ICD-10-CM | POA: Diagnosis not present

## 2015-02-01 DIAGNOSIS — Z87891 Personal history of nicotine dependence: Secondary | ICD-10-CM

## 2015-02-01 DIAGNOSIS — B9689 Other specified bacterial agents as the cause of diseases classified elsewhere: Secondary | ICD-10-CM

## 2015-02-01 DIAGNOSIS — R05 Cough: Secondary | ICD-10-CM

## 2015-02-01 DIAGNOSIS — R918 Other nonspecific abnormal finding of lung field: Secondary | ICD-10-CM

## 2015-02-01 DIAGNOSIS — J208 Acute bronchitis due to other specified organisms: Secondary | ICD-10-CM

## 2015-02-01 DIAGNOSIS — R059 Cough, unspecified: Secondary | ICD-10-CM

## 2015-02-01 MED ORDER — PREDNISONE 10 MG (21) PO TBPK: ORAL_TABLET | ORAL | Status: DC

## 2015-02-01 MED ORDER — BENZONATATE 200 MG PO CAPS: 200.0000 mg | ORAL_CAPSULE | Freq: Three times a day (TID) | ORAL | Status: DC | PRN

## 2015-02-01 MED ORDER — LEVOFLOXACIN 500 MG PO TABS: 500.0000 mg | ORAL_TABLET | Freq: Every day | ORAL | Status: DC

## 2015-02-01 MED ORDER — BENZONATATE 200 MG PO CAPS
200.0000 mg | ORAL_CAPSULE | Freq: Three times a day (TID) | ORAL | Status: DC | PRN
Start: 2015-02-01 — End: 2015-02-18

## 2015-02-01 NOTE — Progress Notes (Signed)
HPI: Joy Golden is a 59 y.o. female who presents to [facility]  today for chief complaint of:  Chief Complaint  Patient presents with  . Acute Visit    recent brochitis finished z-pack & still wheezing w/ tightness in chest    . Location: chest . Quality: cough, congestion, wheeze . Severity: moderate . Duration: 1 week . Context: treated for bronchitis earlier this month with Z-pack, felt better, recently spent time with grandchild diagnosed with pneumonia, has been coughing more and feeling like wheezing in lungs, mostly dry cough but occasionally productive of mucus . Modifying factors: nonsmoker, no secondhand smoke exposure . Assoc signs/symptoms: no fever/chills   Past medical, social and family history reviewed: Past Medical History  Diagnosis Date  . Thyroid disease   . Heart murmur     some say yes, some say no  . Arthritis     Osteoarthritis  . Hypothyroidism    Past Surgical History  Procedure Laterality Date  . Abdominal hysterectomy  1983  . Hernia repair  1970/1980    4 surgeries- abdominal  . Back surgery  2002    L4-L5 laminectomy  . Breast enhancement surgery  2005  . Tonsillectomy  1967  . Carpal tunnel release Right 2010  . Replacement total knee Right 2009  . Ganglion cyst excision Right 2012  . Trigger finger release Right     thumb  . Partial hysterectomy  1976  . Toe surgery Right     great toe horse stepped on it  . Elbow surgery Left 2013  . Carpal tunnel with cubital tunnel Left   . De quervain's release Right   . Sacroiliac joint fusion Left 06/19/2014    Procedure: SACROILIAC JOINT FUSION;  Surgeon: Phylliss Bob, MD;  Location: [facility];  Service: Orthopedics;  Laterality: Left;  Left sided sacroiliac joint fusion   Social History  Substance Use Topics  . Smoking status: Former Smoker -- 14 years  . Smokeless tobacco: Not on file     Comment: Quit at age 67.  Marland Kitchen Alcohol Use: 4.2 oz/week    7 Glasses  of wine, 0 Standard drinks or equivalent per week   Family History  Problem Relation Age of Onset  . Alzheimer's disease Mother   . Cancer Sister     ovarian  . Tuberculosis Sister   . Diabetes Maternal Grandmother   . Cancer Sister     ovarian    Current Outpatient Prescriptions  Medication Sig Dispense Refill  . fluticasone (FLONASE) 50 MCG/ACT nasal spray Place 1 spray into both nostrils daily as needed for allergies or rhinitis.    Marland Kitchen levothyroxine (SYNTHROID, LEVOTHROID) 75 MCG tablet TAKE 1 TABLET (75 MCG TOTAL) BY MOUTH DAILY. 90 tablet 1  . benzonatate (TESSALON) 200 MG capsule Take 1 capsule (200 mg total) by mouth 3 (three) times daily as needed for cough. 30 capsule 1  . levofloxacin (LEVAQUIN) 500 MG tablet Take 1 tablet (500 mg total) by mouth daily. 7 tablet 0  . predniSONE (STERAPRED UNI-PAK 21 TAB) 10 MG (21) TBPK tablet 6-day steroid taper 21 tablet 0   No current facility-administered medications for this visit.   Allergies  Allergen Reactions  . Mobic [Meloxicam]     Eye irritation      Review of Systems: CONSTITUTIONAL:  No  fever, no chills, No  unintentional weight changes HEAD/EYES/EARS/NOSE/THROAT: No headache, no vision change, no hearing change, Yes  sore throat, nosinus pressure CARDIAC:  No chest pain, no pressure/palpitations, no orthopnea RESPIRATORY: Yes  cough, Yes wheeze, No SOB GASTROINTESTINAL: No nausea, no vomiting, no abdominal pain, no blood in stool, no diarrhea, no constipation MUSCULOSKELETAL: No  myalgia/arthralgia GENITOURINARY: No incontinence, No abnormal genital bleeding/discharge SKIN: No rash/wounds/concerning lesions HEM/ONC: No easy bruising/bleeding, no abnormal lymph node ENDOCRINE: No polyuria/polydipsia/polyphagia, no heat/cold intolerance  NEUROLOGIC: No weakness, no dizziness, no slurred speech PSYCHIATRIC: No concerns with depression, no concerns with anxiety, no sleep problems    Exam:  BP 111/76 mmHg  Pulse 69   Wt 131 lb (59.421 kg)  SpO2 95% Constitutional: VSS, see above. General Appearance: alert, well-developed, well-nourished, NAD Eyes: Normal lids and conjunctive, non-icteric sclera, PERRLA Ears, Nose, Mouth, Throat: Normal external inspection ears/nares/mouth/lips/gums, TM normal bilaterally, MMM, posterior pharynx Yes  erythema No  exudate Neck: No masses, trachea midline. No thyroid enlargement/tenderness/mass appreciated. No lymphadenopathy Respiratory: Normal respiratory effort. no wheeze, no rhonchi, no rales Cardiovascular: S1/S2 normal, no murmur, no rub/gallop auscultated. RRR.   No results found for this or any previous visit (from the past 72 hour(s)).  CXR personally reviewed, question infiltrate on R vs bronchial markings, bronchial inflammation. NO mass or lobar pneumonia noted, await radiology overread  ASSESSMENT/PLAN:  Cough - Plan: DG Chest 2 View, benzonatate (TESSALON) 200 MG capsule  Acute bacterial bronchitis - Plan: levofloxacin (LEVAQUIN) 500 MG tablet, predniSONE (STERAPRED UNI-PAK 21 TAB) 10 MG (21) TBPK tablet, possibly community acquired pneumonia particularly given recent sick contacts, also consideration for viral process. Reassuring that no fever/chills, nonsmoker. RTC if no better and will repeat CXR  Return if symptoms worsen or fail to improve.

## 2015-02-01 NOTE — Patient Instructions (Signed)
Return to clinic for further evaluation if you are not feeling better in 1 week, come sooner if you start to feel worse or have any other concerns.  Cough may linger for 1 - 2 weeks after resolution of acute illness.  Hand washing and cover your cough to prevent spread of germs! Feel better soon!

## 2015-02-18 ENCOUNTER — Encounter: Payer: Self-pay | Admitting: Physician Assistant

## 2015-02-18 ENCOUNTER — Ambulatory Visit (INDEPENDENT_AMBULATORY_CARE_PROVIDER_SITE_OTHER): Payer: 59 | Admitting: Physician Assistant

## 2015-02-18 VITALS — BP 105/68 | HR 52 | Ht 63.0 in | Wt 129.0 lb

## 2015-02-18 DIAGNOSIS — Z1159 Encounter for screening for other viral diseases: Secondary | ICD-10-CM | POA: Diagnosis not present

## 2015-02-18 DIAGNOSIS — Z131 Encounter for screening for diabetes mellitus: Secondary | ICD-10-CM | POA: Diagnosis not present

## 2015-02-18 DIAGNOSIS — L299 Pruritus, unspecified: Secondary | ICD-10-CM

## 2015-02-18 DIAGNOSIS — Z23 Encounter for immunization: Secondary | ICD-10-CM

## 2015-02-18 DIAGNOSIS — Z0000 Encounter for general adult medical examination without abnormal findings: Secondary | ICD-10-CM

## 2015-02-18 DIAGNOSIS — E039 Hypothyroidism, unspecified: Secondary | ICD-10-CM

## 2015-02-18 DIAGNOSIS — Z13220 Encounter for screening for lipoid disorders: Secondary | ICD-10-CM

## 2015-02-18 LAB — COMPLETE METABOLIC PANEL WITH GFR
ALK PHOS: 64 U/L (ref 33–130)
ALT: 19 U/L (ref 6–29)
AST: 23 U/L (ref 10–35)
Albumin: 4.2 g/dL (ref 3.6–5.1)
BILIRUBIN TOTAL: 1.1 mg/dL (ref 0.2–1.2)
BUN: 10 mg/dL (ref 7–25)
CALCIUM: 9.7 mg/dL (ref 8.6–10.4)
CO2: 28 mmol/L (ref 20–31)
CREATININE: 0.87 mg/dL (ref 0.50–0.99)
Chloride: 103 mmol/L (ref 98–110)
GFR, Est African American: 83 mL/min (ref >=60)
GFR, Est Non African American: 72 mL/min (ref >=60)
GLUCOSE: 87 mg/dL (ref 65–99)
POTASSIUM: 4.5 mmol/L (ref 3.5–5.3)
Sodium: 140 mmol/L (ref 135–146)
TOTAL PROTEIN: 6.4 g/dL (ref 6.1–8.1)

## 2015-02-18 LAB — TSH: TSH: 1.981 u[IU]/mL (ref 0.350–4.500)

## 2015-02-18 LAB — LIPID PANEL
CHOLESTEROL: 240 mg/dL — AB (ref 125–200)
HDL: 73 mg/dL (ref >=46)
LDL Cholesterol: 148 mg/dL — ABNORMAL HIGH (ref <130)
Total CHOL/HDL Ratio: 3.3 Ratio (ref <=5.0)
Triglycerides: 97 mg/dL (ref <150)
VLDL: 19 mg/dL (ref <30)

## 2015-02-18 MED ORDER — TRIAMCINOLONE ACETONIDE 0.1 % EX CREA: 1.0000 "application " | TOPICAL_CREAM | Freq: Two times a day (BID) | CUTANEOUS | Status: DC

## 2015-02-18 NOTE — Patient Instructions (Signed)

## 2015-02-18 NOTE — Progress Notes (Signed)
Subjective:     Joy Golden is a 59 y.o. female and is here for a comprehensive physical exam. The patient reports problems - she does have some left mid back itching that has been off and on for over 2 years. no rash noted. nothing makes worse. not tried anything to make better. .  Social History   Social History  . Marital Status: Divorced    Spouse Name: N/A  . Number of Children: N/A  . Years of Education: N/A   Occupational History  . Not on file.   Social History Main Topics  . Smoking status: Former Smoker -- 14 years  . Smokeless tobacco: Not on file     Comment: Quit at age 58.  Marland Kitchen Alcohol Use: 4.2 oz/week    7 Glasses of wine, 0 Standard drinks or equivalent per week  . Drug Use: No  . Sexual Activity: No   Other Topics Concern  . Not on file   Social History Narrative   Health Maintenance  Topic Date Due  . COLONOSCOPY  04/06/2014  . INFLUENZA VACCINE  02/18/2016 (Originally 11/05/2014)  . ZOSTAVAX  02/18/2016 (Originally 09/26/2013)  . Hepatitis C Screening  02/18/2016 (Originally Jan 09, 1956)  . HIV Screening  02/18/2016 (Originally 09/26/1968)  . PAP SMEAR  04/10/2024 (Originally 09/27/1974)  . MAMMOGRAM  09/05/2015  . TETANUS/TDAP  02/17/2025    The following portions of the patient's history were reviewed and updated as appropriate: allergies, current medications, past family history, past medical history, past social history, past surgical history and problem list.  Review of Systems Pertinent items noted in HPI and remainder of comprehensive ROS otherwise negative.   Objective:    BP 105/68 mmHg  Pulse 52  Ht 5\' 3"  (1.6 m)  Wt 129 lb (58.514 kg)  BMI 22.86 kg/m2 General appearance: alert, cooperative and appears stated age Head: Normocephalic, without obvious abnormality, atraumatic Eyes: conjunctivae/corneas clear. PERRL, EOM's intact. Fundi benign. Ears: normal TM's and external ear canals both ears Nose: Nares normal. Septum midline. Mucosa  normal. No drainage or sinus tenderness. Throat: lips, mucosa, and tongue normal; teeth and gums normal Neck: no adenopathy, no carotid bruit, no JVD, supple, symmetrical, trachea midline and thyroid not enlarged, symmetric, no tenderness/mass/nodules Back: symmetric, no curvature. ROM normal. No CVA tenderness. Lungs: clear to auscultation bilaterally Heart: regular rate and rhythm, S1, S2 normal, no murmur, click, rub or gallop Abdomen: soft, non-tender; bowel sounds normal; no masses,  no organomegaly Extremities: extremities normal, atraumatic, no cyanosis or edema Pulses: 2+ and symmetric Skin: Skin color, texture, turgor normal. No rashes or lesions or area around mid back bra line on the left small pin point scabed over papules. no other rash or lesion.  Lymph nodes: Cervical, supraclavicular, and axillary nodes normal. Neurologic: Grossly normal    Assessment:    Healthy female exam.      Plan:    CPE- hepatitis see screening was done. Patient was given tetanus shot today. Patient declined shingles and flu shot. Patient has up to great mammogram. Patient had total hysterectomy and no need for Pap or bimanual. Fasting labs were ordered today. Patient has a history of hypothyroidism and TSH ordered today. Will adjust accordingly. Patient declines referral for colonoscopy. We had a discussion about cold guard. She is interested and will call her insurance. Information given. Discussion about calcium and vitamin D was had. Encouraged patient to start 1500 mg of calcium and vitamin D 800 mg. Follow-up in one year or sooner  if needed.  Itching- reassured patient that I did not see anything that could be causing the itching. We'll check liver enzymes today. There may certainly could be some atopic dermatitis or even irritation from bra. I will send over some triamcinolone cream to use over area at she twice a day for the next couple weeks. Continue to keep area moisturized. See After Visit  Summary for Counseling Recommendations

## 2015-02-19 ENCOUNTER — Encounter: Payer: Self-pay | Admitting: Physician Assistant

## 2015-02-19 DIAGNOSIS — E785 Hyperlipidemia, unspecified: Secondary | ICD-10-CM | POA: Insufficient documentation

## 2015-02-19 LAB — HEPATITIS C ANTIBODY: HCV Ab: NEGATIVE

## 2015-05-14 ENCOUNTER — Other Ambulatory Visit: Payer: Self-pay | Admitting: Physician Assistant

## 2015-06-10 ENCOUNTER — Encounter: Payer: Self-pay | Admitting: Physician Assistant

## 2015-06-10 DIAGNOSIS — E039 Hypothyroidism, unspecified: Secondary | ICD-10-CM

## 2015-06-14 LAB — TSH: TSH: 7.27 m[IU]/L — AB

## 2015-06-17 ENCOUNTER — Other Ambulatory Visit: Payer: Self-pay | Admitting: Physician Assistant

## 2015-06-17 ENCOUNTER — Ambulatory Visit: Payer: Self-pay | Admitting: Physician Assistant

## 2015-06-17 MED ORDER — LEVOTHYROXINE SODIUM 88 MCG PO TABS
88.0000 ug | ORAL_TABLET | Freq: Every day | ORAL | Status: DC
Start: 2015-06-17 — End: 2015-09-04

## 2015-06-18 ENCOUNTER — Other Ambulatory Visit: Payer: Self-pay | Admitting: *Deleted

## 2015-06-18 DIAGNOSIS — E039 Hypothyroidism, unspecified: Secondary | ICD-10-CM

## 2015-07-08 ENCOUNTER — Other Ambulatory Visit: Payer: Self-pay | Admitting: Physician Assistant

## 2015-07-08 ENCOUNTER — Encounter: Payer: Self-pay | Admitting: Physician Assistant

## 2015-07-08 MED ORDER — DICLOFENAC SODIUM 2 % TD SOLN: TRANSDERMAL | Status: DC

## 2015-07-19 ENCOUNTER — Encounter: Payer: Self-pay | Admitting: Physician Assistant

## 2015-07-23 ENCOUNTER — Other Ambulatory Visit: Payer: Self-pay | Admitting: Physician Assistant

## 2015-07-23 MED ORDER — IBUPROFEN 800 MG PO TABS: 800.0000 mg | ORAL_TABLET | Freq: Three times a day (TID) | ORAL | Status: DC | PRN

## 2015-08-23 ENCOUNTER — Encounter: Payer: Self-pay | Admitting: Physician Assistant

## 2015-09-04 ENCOUNTER — Other Ambulatory Visit: Payer: Self-pay | Admitting: *Deleted

## 2015-09-04 MED ORDER — LEVOTHYROXINE SODIUM 88 MCG PO TABS: 88.0000 ug | ORAL_TABLET | Freq: Every day | ORAL | Status: DC

## 2015-09-09 DIAGNOSIS — E039 Hypothyroidism, unspecified: Secondary | ICD-10-CM | POA: Diagnosis not present

## 2015-09-09 LAB — TSH: TSH: 2.05 u[IU]/mL (ref 0.41–5.90)

## 2015-09-12 ENCOUNTER — Other Ambulatory Visit: Payer: Self-pay | Admitting: *Deleted

## 2015-09-12 MED ORDER — LEVOTHYROXINE SODIUM 88 MCG PO TABS: 88.0000 ug | ORAL_TABLET | Freq: Every day | ORAL | Status: DC

## 2015-10-02 ENCOUNTER — Encounter: Payer: Self-pay | Admitting: Physician Assistant

## 2015-11-07 ENCOUNTER — Other Ambulatory Visit: Payer: Self-pay | Admitting: *Deleted

## 2015-11-07 MED ORDER — LEVOTHYROXINE SODIUM 88 MCG PO TABS: 88.0000 ug | ORAL_TABLET | Freq: Every day | ORAL | 0 refills | Status: DC

## 2016-01-17 DIAGNOSIS — N3001 Acute cystitis with hematuria: Secondary | ICD-10-CM | POA: Diagnosis not present

## 2016-01-17 DIAGNOSIS — R300 Dysuria: Secondary | ICD-10-CM | POA: Diagnosis not present

## 2016-03-02 ENCOUNTER — Ambulatory Visit (INDEPENDENT_AMBULATORY_CARE_PROVIDER_SITE_OTHER): Payer: BLUE CROSS/BLUE SHIELD | Admitting: Physician Assistant

## 2016-03-02 ENCOUNTER — Encounter: Payer: Self-pay | Admitting: Physician Assistant

## 2016-03-02 VITALS — BP 112/66 | HR 53 | Ht 63.0 in | Wt 134.0 lb

## 2016-03-02 DIAGNOSIS — Z114 Encounter for screening for human immunodeficiency virus [HIV]: Secondary | ICD-10-CM

## 2016-03-02 DIAGNOSIS — Z131 Encounter for screening for diabetes mellitus: Secondary | ICD-10-CM

## 2016-03-02 DIAGNOSIS — Z0000 Encounter for general adult medical examination without abnormal findings: Secondary | ICD-10-CM | POA: Diagnosis not present

## 2016-03-02 DIAGNOSIS — L299 Pruritus, unspecified: Secondary | ICD-10-CM

## 2016-03-02 DIAGNOSIS — E039 Hypothyroidism, unspecified: Secondary | ICD-10-CM | POA: Diagnosis not present

## 2016-03-02 DIAGNOSIS — Z13220 Encounter for screening for lipoid disorders: Secondary | ICD-10-CM

## 2016-03-02 DIAGNOSIS — Z1231 Encounter for screening mammogram for malignant neoplasm of breast: Secondary | ICD-10-CM

## 2016-03-02 LAB — CBC WITH DIFFERENTIAL/PLATELET
BASOS ABS: 64 {cells}/uL (ref 0–200)
BASOS PCT: 1 %
EOS PCT: 4 %
Eosinophils Absolute: 256 cells/uL (ref 15–500)
HCT: 44.6 % (ref 35.0–45.0)
Hemoglobin: 14.5 g/dL (ref 11.7–15.5)
LYMPHS ABS: 2496 {cells}/uL (ref 850–3900)
Lymphocytes Relative: 39 %
MCH: 30.8 pg (ref 27.0–33.0)
MCHC: 32.5 g/dL (ref 32.0–36.0)
MCV: 94.7 fL (ref 80.0–100.0)
MONOS PCT: 7 %
MPV: 10.5 fL (ref 7.5–12.5)
Monocytes Absolute: 448 cells/uL (ref 200–950)
NEUTROS ABS: 3136 {cells}/uL (ref 1500–7800)
Neutrophils Relative %: 49 %
PLATELETS: 348 10*3/uL (ref 140–400)
RBC: 4.71 MIL/uL (ref 3.80–5.10)
RDW: 12.9 % (ref 11.0–15.0)
WBC: 6.4 10*3/uL (ref 3.8–10.8)

## 2016-03-02 LAB — TSH: TSH: 1.74 m[IU]/L

## 2016-03-02 MED ORDER — HYDROXYZINE HCL 10 MG PO TABS: 10.0000 mg | ORAL_TABLET | Freq: Three times a day (TID) | ORAL | 1 refills | Status: DC | PRN

## 2016-03-02 NOTE — Patient Instructions (Signed)

## 2016-03-02 NOTE — Progress Notes (Addendum)
Subjective:    Patient ID: Joy Golden, female    DOB: 08-Sep-1955, 60 y.o.   MRN: [id number]  HPI  Patient is a 60 yo female coming to the clinic for an annual physical. Patient complains of her mid-back itching for a year. Patient had this same compliant last year at her annual physical and was given Kenalog for the itching. Patient has used the Kenalog for a year; however, it gives the patient no relief.  She denies any burning or pain in that area. Patient has tried wearing different bras to see if it would help the itching and it has not seemed to help her symptoms.   Patient also complains of her left ear itching in the external auditory canal. She also reports that the skin is flaky and sometimes there is clear discharge. Patient denies any ear pain.    Health Maintenance:  Patient decided to get Cologaurd testing instead of colonoscopy. Patient denied Zostavax due to patient having the shingles six years ago. Patient's last mammogram was one year ago.    Review of Systems  Constitutional: Negative for appetite change, fatigue and fever.  HENT: Positive for ear discharge. Negative for congestion, ear pain, rhinorrhea, sinus pain, sinus pressure and tinnitus.   Eyes: Negative for pain, discharge and visual disturbance.  Respiratory: Negative for cough, chest tightness, shortness of breath and wheezing.   Cardiovascular: Negative for chest pain and palpitations.  Gastrointestinal: Negative for abdominal distention, abdominal pain, constipation and diarrhea.  Genitourinary: Negative for dysuria, frequency and vaginal discharge.  Musculoskeletal: Negative for gait problem, joint swelling and neck pain.  Neurological: Negative for dizziness, weakness and light-headedness.  Psychiatric/Behavioral: Negative for agitation, confusion and dysphoric mood.       Objective:   Physical Exam  Constitutional: She is oriented to person, place, and time. She appears well-developed and  well-nourished.  HENT:  Head: Normocephalic and atraumatic.  External ear canal scaly bilaterally.Tympanic membranes clear and visualized bilaterally.   Eyes: Pupils are equal, round, and reactive to light.  Neck: Neck supple. No tracheal deviation present. No thyromegaly present.  Cardiovascular: Normal rate and regular rhythm.  Exam reveals no gallop and no friction rub.   No murmur heard. Pulmonary/Chest: Effort normal and breath sounds normal. No respiratory distress. She has no wheezes. She has no rales. She exhibits no tenderness.  Abdominal: Soft. Bowel sounds are normal. She exhibits no distension and no mass. There is no tenderness. There is no rebound and no guarding.  Musculoskeletal: She exhibits no edema or tenderness.  Neurological: She is alert and oriented to person, place, and time. No cranial nerve deficit (Cranial nerves II-XII grossly intact. ).  Skin:  On mid-back at bra line- scattered erythematous papules with scattered seborrheic keratosis.           Assessment & Plan:  Tysa was seen today for annual exam.  Diagnoses and all orders for this visit:  Encounter for screening for HIV -     HIV antibody (with reflex)  Patient agreed to get HIV screening due to never being screened before.   Screening for lipid disorders -     Lipid panel  Check lipid panel. Last lipid panel was performed one year ago.   Screening for diabetes mellitus -     COMPLETE METABOLIC PANEL WITH GFR  Get CMP to check glucose to screen for diabetes, along with monitoring electrolytes and kidney function.   Visit for screening mammogram -     MM DIGITAL  SCREENING BILATERAL; Future  Mammogram scheduled for 04/01/2016. Patient gets mammograms every year.   Hypothyroidism, unspecified type -     TSH  Re-check TSH due to patient having hypothyroidism. If values are within normal limits then continue taking Levothyroxine 88 MCG. If values are abnormal, then increase dosage of  levothyroxine.   Itching -     CBC with Differential/Platelet -     hydrOXYzine (ATARAX/VISTARIL) 10 MG tablet; Take 1 tablet (10 mg total) by mouth 3 (three) times daily as needed.  Get CBC to check eosinophil count. Start patient on hydroxyzine to help with itching on patient's back.  Advised patient to use Kenalog on right ear to help with itching it appears to be seborrheic dermatitis.   Discussed with patient to start ASA 81mg  daily. Consider vitamin D 800 units and calcium 1500mg  daily.

## 2016-03-03 ENCOUNTER — Other Ambulatory Visit: Payer: Self-pay | Admitting: *Deleted

## 2016-03-03 MED ORDER — LEVOTHYROXINE SODIUM 88 MCG PO TABS: 88.0000 ug | ORAL_TABLET | Freq: Every day | ORAL | 1 refills | Status: DC

## 2016-03-03 NOTE — Progress Notes (Signed)
Call pt:  Thyroid is great. Amber, please send 90 day supply for 6 month refill.   Cbc unremarkable. Let me know how hydroxyzine makes her feel.

## 2016-03-17 ENCOUNTER — Encounter: Payer: Self-pay | Admitting: Physician Assistant

## 2016-04-01 ENCOUNTER — Ambulatory Visit: Payer: BLUE CROSS/BLUE SHIELD

## 2016-04-23 DIAGNOSIS — N3001 Acute cystitis with hematuria: Secondary | ICD-10-CM | POA: Diagnosis not present

## 2016-04-29 ENCOUNTER — Ambulatory Visit: Payer: BLUE CROSS/BLUE SHIELD

## 2016-04-30 ENCOUNTER — Ambulatory Visit: Payer: BLUE CROSS/BLUE SHIELD

## 2016-05-04 ENCOUNTER — Ambulatory Visit (INDEPENDENT_AMBULATORY_CARE_PROVIDER_SITE_OTHER): Payer: BLUE CROSS/BLUE SHIELD

## 2016-05-04 ENCOUNTER — Ambulatory Visit (INDEPENDENT_AMBULATORY_CARE_PROVIDER_SITE_OTHER): Payer: BLUE CROSS/BLUE SHIELD | Admitting: Family Medicine

## 2016-05-04 ENCOUNTER — Ambulatory Visit (INDEPENDENT_AMBULATORY_CARE_PROVIDER_SITE_OTHER): Payer: BLUE CROSS/BLUE SHIELD | Admitting: Physician Assistant

## 2016-05-04 ENCOUNTER — Encounter: Payer: Self-pay | Admitting: Physician Assistant

## 2016-05-04 VITALS — BP 111/72 | HR 47 | Wt 135.0 lb

## 2016-05-04 VITALS — BP 111/72 | HR 47

## 2016-05-04 DIAGNOSIS — M7741 Metatarsalgia, right foot: Secondary | ICD-10-CM | POA: Diagnosis not present

## 2016-05-04 DIAGNOSIS — M25571 Pain in right ankle and joints of right foot: Secondary | ICD-10-CM | POA: Diagnosis not present

## 2016-05-04 DIAGNOSIS — M79671 Pain in right foot: Secondary | ICD-10-CM | POA: Diagnosis not present

## 2016-05-04 MED ORDER — NAPROXEN 500 MG PO TABS: 500.0000 mg | ORAL_TABLET | Freq: Two times a day (BID) | ORAL | 2 refills | Status: DC

## 2016-05-04 NOTE — Progress Notes (Signed)
Joy Golden is a 60 y.o. female who presents to [facility] today for right foot pain.  Patient was referred by her primary care provider Iran Planas, PA-C   She has had pain on weight bearing under the ball of her right foot for the past 6 months. Pain is located beneath the 2nd-4th MTP joints. It was initially controlled with ibuprofen but has since worsened despite ice and motrin 800 mg BID for the past week. Diclofenac gel makes her skin slough off. Pain can be up to 7/10 intensity and does not radiate. She has had to cut back on walking and hiking. She tried bearing new shoes recently with no relief. She had a fusion of her fist MTP joint 10 years ago.   Past Medical History:  Diagnosis Date  . Arthritis    Osteoarthritis  . Heart murmur    some say yes, some say no  . Hypothyroidism   . Thyroid disease    Past Surgical History:  Procedure Laterality Date  . ABDOMINAL HYSTERECTOMY  1983  . BACK SURGERY  2002   L4-L5 laminectomy  . BREAST ENHANCEMENT SURGERY  2005  . CARPAL TUNNEL RELEASE Right 2010  . CARPAL TUNNEL WITH CUBITAL TUNNEL Left   . DE QUERVAIN'S RELEASE Right   . ELBOW SURGERY Left 2013  . GANGLION CYST EXCISION Right 2012  . HERNIA REPAIR  1970/1980   4 surgeries- abdominal  . PARTIAL HYSTERECTOMY  1976  . REPLACEMENT TOTAL KNEE Right 2009  . SACROILIAC JOINT FUSION Left 06/19/2014   Procedure: SACROILIAC JOINT FUSION;  Surgeon: Phylliss Bob, MD;  Location: [facility];  Service: Orthopedics;  Laterality: Left;  Left sided sacroiliac joint fusion  . TOE SURGERY Right    great toe horse stepped on it  . TONSILLECTOMY  1967  . TRIGGER FINGER RELEASE Right    thumb   Social History  Substance Use Topics  . Smoking status: Former Smoker    Years: 14.00  . Smokeless tobacco: Never Used     Comment: Quit at age 69.  Marland Kitchen Alcohol use 4.2 oz/week    7 Glasses of wine per week     ROS:  No headache, visual  changes, nausea, vomiting, diarrhea, constipation, dizziness, abdominal pain, skin rash, fevers, chills, night sweats, weight loss, swollen lymph nodes, body aches, joint swelling, muscle aches, chest pain, shortness of breath, mood changes, visual or auditory hallucinations.     Medications: Current Outpatient Prescriptions  Medication Sig Dispense Refill  . levothyroxine (SYNTHROID, LEVOTHROID) 88 MCG tablet Take 1 tablet (88 mcg total) by mouth daily. 90 tablet 1  . naproxen (NAPROSYN) 500 MG tablet Take 1 tablet (500 mg total) by mouth 2 (two) times daily with a meal. 60 tablet 2  . triamcinolone cream (KENALOG) 0.1 % Apply 1 application topically 2 (two) times daily. (Patient not taking: Reported on 05/04/2016) 60 g 0   No current facility-administered medications for this visit.    Allergies  Allergen Reactions  . Mobic [Meloxicam]     Eye irritation     Exam:  BP 111/72   Pulse (!) 47  General: Well Developed, well nourished, and in no acute distress.  Neuro/Psych: Alert and oriented x3, extra-ocular muscles intact, able to move all 4 extremities, sensation grossly intact. Skin: Warm and dry, no rashes noted.  Respiratory: Not using accessory muscles, speaking in full sentences, trachea midline.  Cardiovascular: Pulses palpable, no extremity edema. Abdomen: Does not appear  distended. MSK: Right foot normal-appearing Aside from well-appearing mature scar dorsal first MTP, nontender to palpation and midfoot compression. No pain on toe flexion or extension. Pain beneath the 2nd-4th MTP joints on weight bearing. No significant motion of first MTP   Patient was fitted with a small metatarsal pad and after adjustment felt significant improvement in pain with ambulation.  No results found for this or any previous visit (from the past 48 hour(s)). Dg Foot Complete Right  Result Date: 05/04/2016 CLINICAL DATA:  Right foot pain at the second- fourth metatarsals for 6 months EXAM:  RIGHT FOOT COMPLETE - 3+ VIEW COMPARISON:  None. FINDINGS: No fracture or dislocation. Prior first MTP joint arthrodesis without failure or complication. No significant arthropathy throughout the remainder of the foot. No lytic or sclerotic osseous lesion. Normal soft tissues. IMPRESSION: No acute osseous injury of the right foot. Electronically Signed   By: Kathreen Devoid   On: 05/04/2016 14:33    Assessment and Plan: 61 y.o. female with right metatarsalgia. Possible predisposing factors include Morton's toe and 1st MTP joint fusion 10 years ago with subsequent Turff toe. - Small metatarsal toe pad inserted today - Naproxen - F/u 4-6 weeks if needed; if no improvement, consider orthotics and injection.  No orders of the defined types were placed in this encounter.   Discussed warning signs or symptoms. Please see discharge instructions. Patient expresses understanding.  CC: Iran Planas, PA-C

## 2016-05-04 NOTE — Patient Instructions (Signed)
Thank you for coming in today. I recommend Metataral pads from [employer] size small.   Also consider a a Turff toe or Mortons toe plate.  I recommend a full length carbon fiber one.   Recheck in 4-6 weeks if needed.   The felt pads typically last about 3 months.   Metatarsalgia.

## 2016-05-04 NOTE — Progress Notes (Addendum)
   Subjective:    Patient ID: Joy Golden, female    DOB: November 20, 1955, 61 y.o.   MRN: [id number]  HPI Patient is a 61yo female who presents to the clinic for right foot pain on the ball on her feet that started 6 months ago and has been worsening over the past month.  Patient states she lives an active lifestyle frequently walking and hiking, which she has had to take a break from.  Patient states she is not able to walk as far and the pain can be severe, 7/10.  Patient states the pain is on the pad of her right foot toes 2-4.  Patient states she had a fusion of her first MTP joint 10 years ago, and she feels that joint is moving toward her other toes.  Patient states she has been taking Motrin 800mg  twice a day for the past 1.5 weeks and using ice and biofreeze.  She states she has also switched shoes, which has a helped a little bit.  Patient denies numbness or tingling.  She states the foot pain is worse with standing and pressure on the pad of her foot, which has caused her to walk different for relief.  Patient would like a steroid injection if possible and a referral to [facility], who she saw for a SI joint fusion in 2016.         Review of Systems  All other systems reviewed and are negative.      Objective:   Physical Exam  Constitutional: She is oriented to person, place, and time. She appears well-developed and well-nourished.  HENT:  Head: Normocephalic and atraumatic.  Musculoskeletal:  Right foot: Scar over first MTP joint due to fusion of MTP.  No ROM in this joint.  First MTP joint appears to be adducting. Signs of bruising over the head of the anterior second DIP joint.   No signs of ankle effusion.  Pad of MTP joints appears edematous. Minimal tenderness to palpation of MTPs.  Pain worse when asked to applied pressure to these joints.   Neurological: She is alert and oriented to person, place, and time.  Skin: Skin is warm and dry.  Psychiatric: She has a  normal mood and affect. Her behavior is normal.  Nursing note and vitals reviewed.         Assessment & Plan:  Marland KitchenMarland KitchenMegen was seen today for right foot pain.  Diagnoses and all orders for this visit:  Metatarsalgia of right foot -     naproxen (NAPROSYN) 500 MG tablet; Take 1 tablet (500 mg total) by mouth 2 (two) times daily with a meal. -     DG Foot Complete Right; Future   Due to the severity of her pain and this being a progressive over the past 6 months, an X-ray of her right foot was ordered.    Patient will be started on Naproxen 500mg  for her pain.    Patient will see Dr. Georgina Snell this afternoon for further assessment.

## 2016-05-04 NOTE — Patient Instructions (Signed)
Morton Neuralgia Introduction Morton neuralgia is a type of foot pain in the area closest to your toes. This area is sometimes called the ball of your foot. Morton neuralgia occurs when a branch of a nerve in your foot (digital nerve) becomes compressed. When this happens over a long period of time, the nerve can thicken (neuroma) and cause pain. This usually occurs between the third and fourth toe. Morton neuralgia can come and go but may get worse over time. What are the causes? Your digital nerve can become compressed and stretched at a point where it passes under a thick band of tissue that connects your toes (intermetatarsal ligament). Morton neuralgia can be caused by mild repetitive damage in this area. This type of damage can result from:  Activities such as running or jumping.  Wearing shoes that are too tight. What increases the risk? You may be at risk for Morton neuralgia if you:  Are female.  Wear high heels.  Wear shoes that are narrow or tight.  Participate in activities that stretch your toes. These include:  Running.  Ballet.  Long-distance walking. What are the signs or symptoms? The first symptom of Morton neuralgia is pain that spreads from the ball of your foot to your toes. It may feel like you are walking on a marble. Pain usually gets worse with walking and goes away at night. Other symptoms may include numbness and cramping of your toes. How is this diagnosed? Your health care provider will do a physical exam. When doing the exam, your health care provider may:  Squeeze your foot just behind your toe.  Ask you to move your toes to check for pain. You may also have tests on your foot to confirm the diagnosis. These may include:  An X-ray.  An MRI. How is this treated? Treatment for Morton neuralgia may be as simple as changing the kind of shoes you wear. Other treatments may include:  Wearing a supportive pad (orthosis) under the front of your foot.  This lifts your toe bones and takes pressure off the nerve.  Getting injections of numbing medicine and anti-inflammatory medicine (steroid) in the nerve.  Having surgery to remove part of the thickened nerve. Follow these instructions at home:  Take medicine only as directed by your health care provider.  Wear soft-soled shoes with a wide toe area.  Stop activities that may be causing pain.  Elevate your foot when resting.  Massage your foot.  Apply ice to the injured area:  Put ice in a plastic bag.  Place a towel between your skin and the bag.  Leave the ice on for 20 minutes, 2-3 times a day.  Keep all follow-up visits as directed by your health care provider. This is important. Contact a health care provider if:  Home care instructions are not helping you get better.  Your symptoms change or get worse. This information is not intended to replace advice given to you by your health care provider. Make sure you discuss any questions you have with your health care provider. Document Released: 06/29/2000 Document Revised: 08/29/2015 Document Reviewed: 05/24/2013  2017 Elsevier  

## 2016-05-08 ENCOUNTER — Ambulatory Visit: Payer: BLUE CROSS/BLUE SHIELD

## 2016-05-12 ENCOUNTER — Ambulatory Visit: Payer: BLUE CROSS/BLUE SHIELD

## 2016-05-12 ENCOUNTER — Ambulatory Visit (INDEPENDENT_AMBULATORY_CARE_PROVIDER_SITE_OTHER): Payer: BLUE CROSS/BLUE SHIELD

## 2016-05-12 ENCOUNTER — Other Ambulatory Visit: Payer: Self-pay | Admitting: Physician Assistant

## 2016-05-12 DIAGNOSIS — Z1231 Encounter for screening mammogram for malignant neoplasm of breast: Secondary | ICD-10-CM

## 2016-08-07 ENCOUNTER — Other Ambulatory Visit: Payer: Self-pay | Admitting: Physician Assistant

## 2016-08-17 ENCOUNTER — Encounter: Payer: Self-pay | Admitting: Physician Assistant

## 2016-08-19 ENCOUNTER — Ambulatory Visit (INDEPENDENT_AMBULATORY_CARE_PROVIDER_SITE_OTHER): Payer: BLUE CROSS/BLUE SHIELD

## 2016-08-19 ENCOUNTER — Ambulatory Visit (INDEPENDENT_AMBULATORY_CARE_PROVIDER_SITE_OTHER): Payer: BLUE CROSS/BLUE SHIELD | Admitting: Family Medicine

## 2016-08-19 ENCOUNTER — Other Ambulatory Visit: Payer: Self-pay | Admitting: Physician Assistant

## 2016-08-19 ENCOUNTER — Encounter: Payer: Self-pay | Admitting: Family Medicine

## 2016-08-19 VITALS — BP 108/72 | HR 51 | Ht 64.0 in | Wt 134.9 lb

## 2016-08-19 DIAGNOSIS — Z981 Arthrodesis status: Secondary | ICD-10-CM | POA: Diagnosis not present

## 2016-08-19 DIAGNOSIS — M79671 Pain in right foot: Secondary | ICD-10-CM

## 2016-08-19 DIAGNOSIS — M79674 Pain in right toe(s): Secondary | ICD-10-CM | POA: Diagnosis not present

## 2016-08-19 MED ORDER — LEVOTHYROXINE SODIUM 88 MCG PO TABS: 88.0000 ug | ORAL_TABLET | Freq: Every day | ORAL | 1 refills | Status: DC

## 2016-08-19 NOTE — Patient Instructions (Signed)
Thank you for coming in today. You should hear from Dr Caffie Pinto soon.  Let me know by the end of the week if you have not heard anything.   Hardware Removal Hardware removal is a procedure to remove from the body any medical parts that were used to repair a broken bone, such as pins, screws, rods, wires, and plates. This procedure may be done to:  Remove medical parts that are normally removed after a broken bone has healed.  Remove medical parts that are causing problems, such as infection or pain.  Remove medical parts that are not working.  Replace medical parts with newer, better materials. Tell a health care provider about:  Any allergies you have.  All medicines you are taking, including vitamins, herbs, eye drops, creams, and over-the-counter medicines. This includes any use of steroids, either by mouth or in cream form.  Any problems you or family members have had with anesthetic medicines.  Any blood disorders you have.  Any surgeries you have had.  Any medical conditions you have.  Possibility of pregnancy, if this applies. What are the risks? Generally, this is a safe procedure. However, problems may occur, including:  Infection.  Bleeding.  Pain.  The bone breaking again (refracture).  A failure to completely remove the medical parts. What happens before the procedure?  Follow instructions from your health care provider about eating or drinking restrictions.  Ask your health care provider about:  Changing or stopping your regular medicines. This is especially important if you are taking diabetes medicines or blood thinners.  Taking medicines such as aspirin and ibuprofen. These medicines can thin your blood. Do not take these medicines before your procedure if your health care provider instructs you not to.  Plan to have someone take you home after the procedure.  If you go home right after the procedure, plan to have someone with you for 24 hours. What  happens during the procedure?  You will lie on an exam table.  Several monitors will be connected to you to keep track of your heart rate, blood pressure, and breathing during the procedure.  An IV tube will be inserted into one of your veins.  You will be given one or more of the following:  A medicine that helps you relax (sedative).  A medicine that numbs the area (local anesthetic).  A medicine that makes you fall asleep (general anesthetic).  A medicine that is injected into your spine that numbs the area below and slightly above the injection site (spinal anesthetic).  X-rays may be taken.  The surgeon will make an incision over the area where the medical parts are located.  The medical parts will be removed.  The incision will be closed with stitches (sutures), staples, or surgical glue.  A bandage (dressing) will be placed over the incision site to keep it clean and dry.  A splint, cast, or removable walking boot may be applied until the area heals. The procedure may vary among health care providers and hospitals. What happens after the procedure?  Your blood pressure, heart rate, breathing rate, and blood oxygen level will be monitored often until the medicines you were given have worn off.  You will stay in the recovery room until you are awake and able to drink fluids.  You may be sleepy and nauseous, and you may feel some pain. This information is not intended to replace advice given to you by your health care provider. Make sure you discuss any  questions you have with your health care provider. Document Released: 01/18/2009 Document Revised: 08/29/2015 Document Reviewed: 03/19/2014 Elsevier Interactive Patient Education  2017 [employer].

## 2016-08-19 NOTE — Progress Notes (Signed)
FYI

## 2016-08-19 NOTE — Progress Notes (Signed)
Joy Golden is a 61 y.o. female who presents to [facility] today for great toe pain. Patient has pain in her right great toe. She had a fusion several years ago. She notes a tender palpable bump along the medial aspect of her proximal phalanx that her great toe. She thinks this is an area where the surgical screw used for fusion is getting close the skin and causing pain. She was seen for this issue several months ago and thought to have metatarsalgia. She notes that she's been walking on the lateral part of her foot and thinks that is causing other issues. She's had a trial of some offloading pads which have not helped.   Past Medical History:  Diagnosis Date  . Arthritis    Osteoarthritis  . Heart murmur    some say yes, some say no  . Hypothyroidism   . Thyroid disease    Past Surgical History:  Procedure Laterality Date  . ABDOMINAL HYSTERECTOMY  1983  . AUGMENTATION MAMMAPLASTY    . BACK SURGERY  2002   L4-L5 laminectomy  . BREAST ENHANCEMENT SURGERY  2005  . CARPAL TUNNEL RELEASE Right 2010  . CARPAL TUNNEL WITH CUBITAL TUNNEL Left   . DE QUERVAIN'S RELEASE Right   . ELBOW SURGERY Left 2013  . GANGLION CYST EXCISION Right 2012  . HERNIA REPAIR  1970/1980   4 surgeries- abdominal  . PARTIAL HYSTERECTOMY  1976  . REPLACEMENT TOTAL KNEE Right 2009  . SACROILIAC JOINT FUSION Left 06/19/2014   Procedure: SACROILIAC JOINT FUSION;  Surgeon: Phylliss Bob, MD;  Location: [facility];  Service: Orthopedics;  Laterality: Left;  Left sided sacroiliac joint fusion  . TOE SURGERY Right    great toe horse stepped on it  . TONSILLECTOMY  1967  . TRIGGER FINGER RELEASE Right    thumb   Social History  Substance Use Topics  . Smoking status: Former Smoker    Years: 14.00  . Smokeless tobacco: Never Used     Comment: Quit at age 72.  Marland Kitchen Alcohol use 4.2 oz/week    7 Glasses of wine per week     ROS:  As above   Medications: Current  Outpatient Prescriptions  Medication Sig Dispense Refill  . naproxen (NAPROSYN) 500 MG tablet Take 1 tablet (500 mg total) by mouth 2 (two) times daily with a meal. 60 tablet 2  . triamcinolone cream (KENALOG) 0.1 % Apply 1 application topically 2 (two) times daily. 60 g 0  . levothyroxine (SYNTHROID, LEVOTHROID) 88 MCG tablet Take 1 tablet (88 mcg total) by mouth daily. 90 tablet 1   No current facility-administered medications for this visit.    Allergies  Allergen Reactions  . Mobic [Meloxicam]     Eye irritation     Exam:  BP 108/72   Pulse (!) 51   Ht 5\' 4"  (1.626 m)   Wt 134 lb 14.4 oz (61.2 kg)   SpO2 99%   BMI 23.16 kg/m  General: Well Developed, well nourished, and in no acute distress.  Neuro/Psych: Alert and oriented x3, extra-ocular muscles intact, able to move all 4 extremities, sensation grossly intact. Skin: Warm and dry, no rashes noted.  Respiratory: Not using accessory muscles, speaking in full sentences, trachea midline.  Cardiovascular: Pulses palpable, no extremity edema. Abdomen: Does not appear distended. MSK: Right great toe fused with no motion at the MTP. There is a tender palpable nodule just underneath the skin along the medial  aspect of the toe exactly where the proud or prominent screw head is visible on x-ray    No results found for this or any previous visit (from the past 48 hour(s)). Dg Foot Complete Right  Result Date: 08/19/2016 CLINICAL DATA:  Right great toe pain. Patient feels like there is a screw loose. Prior surgery. EXAM: RIGHT FOOT COMPLETE - 3+ VIEW COMPARISON:  05/04/2016. FINDINGS: Prior first MTP joint arthrodesis. Hardware intact and in place. Exam appears stable from prior exam. No acute abnormality. No evidence of fracture or dislocation. No radiopaque foreign body. IMPRESSION: Prior first MTP joint arthrodesis. Hardware intact and in place. Exam stable from prior exam. No acute abnormality. Electronically Signed   By: Marcello Moores   Register   On: 08/19/2016 08:28      Assessment and Plan: 61 y.o. female with painful surgical hardware failing conservative management. Plan refer to podiatry for evaluation of removal of the surgical screw that I think is causing pain.    Orders Placed This Encounter  Procedures  . Ambulatory referral to Podiatry    Referral Priority:   Routine    Referral Type:   Consultation    Referral Reason:   Specialty Services Required    Requested Specialty:   Podiatry    Number of Visits Requested:   1   No orders of the defined types were placed in this encounter.   Discussed warning signs or symptoms. Please see discharge instructions. Patient expresses understanding.

## 2016-08-20 ENCOUNTER — Encounter: Payer: Self-pay | Admitting: Podiatry

## 2016-08-20 ENCOUNTER — Ambulatory Visit (INDEPENDENT_AMBULATORY_CARE_PROVIDER_SITE_OTHER): Payer: BLUE CROSS/BLUE SHIELD | Admitting: Podiatry

## 2016-08-20 VITALS — Ht 64.0 in | Wt 134.0 lb

## 2016-08-20 DIAGNOSIS — M79671 Pain in right foot: Secondary | ICD-10-CM

## 2016-08-20 DIAGNOSIS — T8484XA Pain due to internal orthopedic prosthetic devices, implants and grafts, initial encounter: Secondary | ICD-10-CM | POA: Diagnosis not present

## 2016-08-20 MED ORDER — OXYCODONE-ACETAMINOPHEN 7.5-325 MG PO TABS: 1.0000 | ORAL_TABLET | Freq: Four times a day (QID) | ORAL | 0 refills | Status: DC | PRN

## 2016-08-20 NOTE — Patient Instructions (Signed)
Seen for pain in right foot old surgery site with internal fixation devices. Discussed surgical removal of screws and plate. Will schedule ASAP.

## 2016-08-20 NOTE — Progress Notes (Signed)
SUBJECTIVE: 61 y.o. year old female presents over and under the first MPJ area where she had first MPJ Arthrodesis done in 2003.  She is able to feel loosening of screw on the side of bone. Duration of pain is about 6 months. Patient is seeking removal of the implants.  REVIEW OF SYSTEMS: Positive for Hypothyroid and low BP. Pertinent items noted in HPI and remainder of comprehensive ROS otherwise negative.  OBJECTIVE: DERMATOLOGIC EXAMINATION: No abnormal skin lesions.   VASCULAR EXAMINATION OF LOWER LIMBS: All pedal pulses are palpable with normal pulsation.  No edema or erythema at the affected area noted. Capillary Filling times within 3 seconds in all digits.  Temperature gradient from tibial crest to dorsum of foot is within normal bilateral.  NEUROLOGIC EXAMINATION OF THE LOWER LIMBS: All epicritic and tactile sensations grossly intact.  Sharp and Dull discriminatory sensations at the plantar ball of hallux is intact bilateral.   MUSCULOSKELETAL EXAMINATION: Positive for palpable mass from internal fixation screw at the medial aspect of the first Metatarsal head area right foot. S/P fusion first MPJ with plate and screws, total 7.  RADIOGRAPHIC studies done at [employer]. Findings include stable fixation on the first MPJ fusion site with fusion completion and without abnormal findings  ASSESSMENT: Poor tolerance to internal fixation screws and plate.  PLAN: Reviewed findings. As per request surgery consent form was reviewed for removal of internal fixation screws and plate under IV sedation.

## 2016-08-20 NOTE — Addendum Note (Signed)
Addended by: Camelia Phenes on: 08/20/2016 04:46 PM   Modules accepted: Orders

## 2016-08-21 DIAGNOSIS — T8484XA Pain due to internal orthopedic prosthetic devices, implants and grafts, initial encounter: Secondary | ICD-10-CM | POA: Diagnosis not present

## 2016-08-21 HISTORY — PX: REMOVAL OF IMPLANT: SHX6451

## 2016-08-27 ENCOUNTER — Ambulatory Visit (INDEPENDENT_AMBULATORY_CARE_PROVIDER_SITE_OTHER): Payer: BLUE CROSS/BLUE SHIELD | Admitting: Podiatry

## 2016-08-27 DIAGNOSIS — M79671 Pain in right foot: Secondary | ICD-10-CM | POA: Diagnosis not present

## 2016-08-27 DIAGNOSIS — T8484XA Pain due to internal orthopedic prosthetic devices, implants and grafts, initial encounter: Secondary | ICD-10-CM | POA: Diagnosis not present

## 2016-08-28 ENCOUNTER — Encounter: Payer: Self-pay | Admitting: Podiatry

## 2016-08-28 NOTE — Progress Notes (Signed)
1 week post op right foot, removal of internal fixation devices plate and screws, 5/95/39. Patient denies any discomfort. Surgical wound clean and dry. No edema or erythema noted. Dressing changed. Post op x-ray is consistent with surgery performed. Ok to shower but replace dry dressing during the day. Return in one week.

## 2016-08-28 NOTE — Patient Instructions (Signed)
Normal post op wound healing without complication.  Ok to shower but keep the area covered with dry dressing.  Return in one week.

## 2016-09-02 ENCOUNTER — Encounter: Payer: Self-pay | Admitting: *Deleted

## 2016-09-03 ENCOUNTER — Encounter: Payer: Self-pay | Admitting: Podiatry

## 2016-09-03 ENCOUNTER — Ambulatory Visit (INDEPENDENT_AMBULATORY_CARE_PROVIDER_SITE_OTHER): Payer: BLUE CROSS/BLUE SHIELD | Admitting: Podiatry

## 2016-09-03 DIAGNOSIS — Z9889 Other specified postprocedural states: Secondary | ICD-10-CM

## 2016-09-03 NOTE — Progress Notes (Signed)
2 weeks following removal of internal fixation screws and plate. Patient is doing well without complaints. Incision site healed well. Ok to wear regular shoe.  Concerned with long 2nd toe that is hitting against toe in closed in shoe. May benefit from shortening procedure.  Return as needed.

## 2016-09-03 NOTE — Patient Instructions (Signed)
2 weeks post op right foot doing well. Return as needed.

## 2016-10-26 ENCOUNTER — Telehealth: Payer: Self-pay | Admitting: Physician Assistant

## 2016-10-26 ENCOUNTER — Other Ambulatory Visit: Payer: Self-pay | Admitting: Physician Assistant

## 2016-10-26 NOTE — Telephone Encounter (Signed)
Joy Golden: Pt called. She is questioning why she was only given a 30 day supply of the Levothyroxine and  I explained to her this is  because she is due for labwork.  Pt is currently in [place] and she said [name] told her she was going to give her refills until November which is  when she is due back in the [place]. However, when pt was seen last in November 2017 she was given a 6 mnth supply which would put her back in here May 2018 for labwork per [name]. Please advise.  Thank you

## 2016-10-26 NOTE — Telephone Encounter (Signed)
TSH only needs to be monitored annually. Okay to give her enough medicine to last her through November. She should make an appt w/Jade for November

## 2016-10-27 ENCOUNTER — Other Ambulatory Visit: Payer: Self-pay

## 2016-10-27 MED ORDER — LEVOTHYROXINE SODIUM 88 MCG PO TABS: 88.0000 ug | ORAL_TABLET | Freq: Every day | ORAL | 2 refills | Status: DC

## 2016-10-27 MED ORDER — LEVOTHYROXINE SODIUM 88 MCG PO TABS: 88.0000 ug | ORAL_TABLET | Freq: Every day | ORAL | 0 refills | Status: DC

## 2016-10-27 MED ORDER — LEVOTHYROXINE SODIUM 88 MCG PO TABS: ORAL_TABLET | ORAL | 2 refills | Status: DC

## 2016-10-27 NOTE — Telephone Encounter (Signed)
Pt informed. Rx sent to pharmacy per [name] Ban, PA

## 2017-02-05 ENCOUNTER — Telehealth: Payer: Self-pay | Admitting: Physician Assistant

## 2017-02-05 DIAGNOSIS — E785 Hyperlipidemia, unspecified: Secondary | ICD-10-CM

## 2017-02-05 DIAGNOSIS — Z131 Encounter for screening for diabetes mellitus: Secondary | ICD-10-CM

## 2017-02-05 DIAGNOSIS — E039 Hypothyroidism, unspecified: Secondary | ICD-10-CM

## 2017-02-05 NOTE — Telephone Encounter (Signed)
Patient called scheduled a physical for 03/08/17 would like to get labs done within the next week or after 02/15/17. Thanks

## 2017-02-08 NOTE — Telephone Encounter (Signed)
Labs ordered.  LMOM notifying pt.

## 2017-02-11 DIAGNOSIS — E039 Hypothyroidism, unspecified: Secondary | ICD-10-CM | POA: Diagnosis not present

## 2017-02-11 DIAGNOSIS — Z131 Encounter for screening for diabetes mellitus: Secondary | ICD-10-CM | POA: Diagnosis not present

## 2017-02-11 DIAGNOSIS — E785 Hyperlipidemia, unspecified: Secondary | ICD-10-CM | POA: Diagnosis not present

## 2017-02-11 LAB — COMPLETE METABOLIC PANEL WITH GFR
AG RATIO: 1.8 (calc) (ref 1.0–2.5)
ALKALINE PHOSPHATASE (APISO): 68 U/L (ref 33–130)
ALT: 16 U/L (ref 6–29)
AST: 22 U/L (ref 10–35)
Albumin: 4.2 g/dL (ref 3.6–5.1)
BILIRUBIN TOTAL: 0.9 mg/dL (ref 0.2–1.2)
BUN: 12 mg/dL (ref 7–25)
CHLORIDE: 103 mmol/L (ref 98–110)
CO2: 31 mmol/L (ref 20–32)
Calcium: 9.6 mg/dL (ref 8.6–10.4)
Creat: 0.86 mg/dL (ref 0.50–0.99)
GFR, EST NON AFRICAN AMERICAN: 72 mL/min/{1.73_m2} (ref >=60)
GFR, Est African American: 83 mL/min/{1.73_m2} (ref >=60)
GLOBULIN: 2.4 g/dL (ref 1.9–3.7)
Glucose, Bld: 97 mg/dL (ref 65–99)
POTASSIUM: 4.5 mmol/L (ref 3.5–5.3)
SODIUM: 140 mmol/L (ref 135–146)
Total Protein: 6.6 g/dL (ref 6.1–8.1)

## 2017-02-11 LAB — TSH: TSH: 6.05 mIU/L — ABNORMAL HIGH (ref 0.40–4.50)

## 2017-02-11 LAB — LIPID PANEL W/REFLEX DIRECT LDL
CHOLESTEROL: 237 mg/dL — AB (ref <200)
HDL: 78 mg/dL (ref >50)
LDL Cholesterol (Calc): 142 mg/dL (calc) — ABNORMAL HIGH
Non-HDL Cholesterol (Calc): 159 mg/dL (calc) — ABNORMAL HIGH (ref <130)
Total CHOL/HDL Ratio: 3 (calc) (ref <5.0)
Triglycerides: 74 mg/dL (ref <150)

## 2017-02-12 ENCOUNTER — Other Ambulatory Visit: Payer: Self-pay | Admitting: *Deleted

## 2017-02-12 MED ORDER — LEVOTHYROXINE SODIUM 100 MCG PO TABS: 100.0000 ug | ORAL_TABLET | Freq: Every day | ORAL | 1 refills | Status: DC

## 2017-02-12 NOTE — Telephone Encounter (Signed)
I just wanted to make sure she is taking it.  Please send over 146mcg to pharmacy #[phone] 1 refill recheck in 6 weeks.

## 2017-02-12 NOTE — Telephone Encounter (Signed)
Call pt: kidney, liver, glucose looks great.  Cholesterol not quite a goal will talk about at CPE.  Thyroid is elevated meaning we should increase medication. Are you taking 51mcg daily in am of synthroid without any other medications?

## 2017-03-08 ENCOUNTER — Encounter: Payer: Self-pay | Admitting: Physician Assistant

## 2017-03-08 ENCOUNTER — Ambulatory Visit (INDEPENDENT_AMBULATORY_CARE_PROVIDER_SITE_OTHER): Payer: BLUE CROSS/BLUE SHIELD | Admitting: Physician Assistant

## 2017-03-08 VITALS — BP 118/70 | HR 47 | Ht 64.0 in | Wt 138.0 lb

## 2017-03-08 DIAGNOSIS — E039 Hypothyroidism, unspecified: Secondary | ICD-10-CM | POA: Diagnosis not present

## 2017-03-08 DIAGNOSIS — E785 Hyperlipidemia, unspecified: Secondary | ICD-10-CM

## 2017-03-08 DIAGNOSIS — K581 Irritable bowel syndrome with constipation: Secondary | ICD-10-CM | POA: Diagnosis not present

## 2017-03-08 DIAGNOSIS — Z0000 Encounter for general adult medical examination without abnormal findings: Secondary | ICD-10-CM

## 2017-03-08 DIAGNOSIS — L720 Epidermal cyst: Secondary | ICD-10-CM

## 2017-03-08 DIAGNOSIS — E7800 Pure hypercholesterolemia, unspecified: Secondary | ICD-10-CM | POA: Insufficient documentation

## 2017-03-08 MED ORDER — TRIAMCINOLONE ACETONIDE 0.1 % EX CREA: 1.0000 "application " | TOPICAL_CREAM | Freq: Two times a day (BID) | CUTANEOUS | 0 refills | Status: DC

## 2017-03-08 MED ORDER — LEVOTHYROXINE SODIUM 100 MCG PO TABS: 100.0000 ug | ORAL_TABLET | Freq: Every day | ORAL | 0 refills | Status: DC

## 2017-03-08 MED ORDER — PB-HYOSCY-ATROPINE-SCOPOLAMINE 16.2 MG PO TABS: ORAL_TABLET | ORAL | 1 refills | Status: DC

## 2017-03-08 NOTE — Progress Notes (Signed)
Subjective:     Joy Golden is a 61 y.o. female and is here for a comprehensive physical exam. The patient reports problems - she does have small hard white tiny bumps on neck and skin that she doesn't know what they are and wants gone. she has gotten a few facials and not able to remove them as well. .  Social History   Socioeconomic History  . Marital status: Divorced    Spouse name: Not on file  . Number of children: Not on file  . Years of education: Not on file  . Highest education level: Not on file  Social Needs  . Financial resource strain: Not on file  . Food insecurity - worry: Not on file  . Food insecurity - inability: Not on file  . Transportation needs - medical: Not on file  . Transportation needs - non-medical: Not on file  Occupational History  . Not on file  Tobacco Use  . Smoking status: Former Smoker    Years: 14.00  . Smokeless tobacco: Never Used  . Tobacco comment: Quit at age 89.  Substance and Sexual Activity  . Alcohol use: Yes    Alcohol/week: 4.2 oz    Types: 7 Glasses of wine per week  . Drug use: No  . Sexual activity: No  Other Topics Concern  . Not on file  Social History Narrative  . Not on file   Health Maintenance  Topic Date Due  . COLONOSCOPY  09/02/2017 (Originally 04/06/2014)  . HIV Screening  09/02/2017 (Originally 09/26/1968)  . INFLUENZA VACCINE  03/08/2022 (Originally 11/04/2016)  . PAP SMEAR  04/10/2024 (Originally 09/27/1974)  . MAMMOGRAM  05/12/2018  . TETANUS/TDAP  02/17/2025  . Hepatitis C Screening  Completed    The following portions of the patient's history were reviewed and updated as appropriate: allergies, current medications, past family history, past medical history, past social history, past surgical history and problem list.  Review of Systems Pertinent items noted in HPI and remainder of comprehensive ROS otherwise negative.   Objective:    BP 118/70   Pulse (!) 47   Ht 5' 4"  (1.626 m)   Wt 138 lb (62.6 kg)    BMI 23.69 kg/m  General appearance: alert, cooperative and appears stated age Head: Normocephalic, without obvious abnormality, atraumatic Eyes: conjunctivae/corneas clear. PERRL, EOM's intact. Fundi benign. Ears: normal TM's and external ear canals both ears Nose: Nares normal. Septum midline. Mucosa normal. No drainage or sinus tenderness. Throat: lips, mucosa, and tongue normal; teeth and gums normal Neck: no adenopathy, no carotid bruit, no JVD, supple, symmetrical, trachea midline and thyroid not enlarged, symmetric, no tenderness/mass/nodules Back: symmetric, no curvature. ROM normal. No CVA tenderness. Lungs: clear to auscultation bilaterally Heart: regular rate and rhythm, S1, S2 normal, no murmur, click, rub or gallop Abdomen: soft, non-tender; bowel sounds normal; no masses,  no organomegaly Extremities: extremities normal, atraumatic, no cyanosis or edema Pulses: 2+ and symmetric Skin: Skin color, texture, turgor normal. No rashes or lesions or tiny white pearl like bump on neck and under right axilla. non tender. Lymph nodes: Cervical, supraclavicular, and axillary nodes normal. Neurologic: Alert and oriented X 3, normal strength and tone. Normal symmetric reflexes. Normal coordination and gait    Assessment:    Healthy female exam.      Plan:    Marland KitchenMarland KitchenAlondria was seen today for annual exam.  Diagnoses and all orders for this visit:  Routine physical examination  Acquired hypothyroidism -  TSH -     Thyroid Peroxidase Antibody -     levothyroxine (SYNTHROID, LEVOTHROID) 100 MCG tablet; Take 1 tablet (100 mcg total) by mouth daily.  Irritable bowel syndrome with constipation -     [place] alk-PHENObarbital (DONNATAL) 16.2 MG tablet; 1-2 tablets every 6-8 hours as needed for abdominal pain.  Milia -     Ambulatory referral to Dermatology  Hyperlipidemia, unspecified hyperlipidemia type  Other orders -     triamcinolone cream (KENALOG) 0.1 %; Apply 1 application  topically 2 (two) times daily.   .. Depression screen [facility] 2/9 03/08/2017  Decreased Interest 0  Down, Depressed, Hopeless 0  PHQ - 2 Score 0   .. Discussed 150 minutes of exercise a week.  Encouraged vitamin D 1000 units and Calcium 1345m or 4 servings of dairy a day.   Pap not indicated.  Discussed shingrix. Declined today.  Declined flu shot.   Discussed labs.  CV 10 yr risk 3.6. Pt will continue to work on lifestyle intervention and natural approaches.  TSH elevated. Increased synthroid. Recheck labs printed to have drawn in 6 weeks added antibodies. Pt wants to know if she has hashimotos. Discussed treated the same way but there is some information on diet related treatment.   Discussed mila. She has tried facials and home treatments. Referral made to dermatology.   IBs controlled with donnatol. Refilled today.   See After Visit Summary for Counseling Recommendations

## 2017-03-08 NOTE — Patient Instructions (Signed)

## 2017-03-11 DIAGNOSIS — K581 Irritable bowel syndrome with constipation: Secondary | ICD-10-CM | POA: Insufficient documentation

## 2017-03-11 DIAGNOSIS — L720 Epidermal cyst: Secondary | ICD-10-CM | POA: Insufficient documentation

## 2017-03-17 ENCOUNTER — Encounter: Payer: Self-pay | Admitting: Physician Assistant

## 2017-03-19 MED ORDER — HYOSCYAMINE SULFATE ER 0.375 MG PO TB12: 0.3750 mg | ORAL_TABLET | Freq: Two times a day (BID) | ORAL | 0 refills | Status: DC | PRN

## 2017-04-01 DIAGNOSIS — L814 Other melanin hyperpigmentation: Secondary | ICD-10-CM | POA: Diagnosis not present

## 2017-04-01 DIAGNOSIS — D2361 Other benign neoplasm of skin of right upper limb, including shoulder: Secondary | ICD-10-CM | POA: Diagnosis not present

## 2017-04-01 DIAGNOSIS — D234 Other benign neoplasm of skin of scalp and neck: Secondary | ICD-10-CM | POA: Diagnosis not present

## 2017-04-01 DIAGNOSIS — D235 Other benign neoplasm of skin of trunk: Secondary | ICD-10-CM | POA: Diagnosis not present

## 2017-04-02 DIAGNOSIS — E039 Hypothyroidism, unspecified: Secondary | ICD-10-CM | POA: Diagnosis not present

## 2017-04-05 ENCOUNTER — Encounter: Payer: Self-pay | Admitting: Physician Assistant

## 2017-04-05 ENCOUNTER — Other Ambulatory Visit: Payer: Self-pay

## 2017-04-05 ENCOUNTER — Other Ambulatory Visit: Payer: Self-pay | Admitting: Physician Assistant

## 2017-04-05 DIAGNOSIS — E063 Autoimmune thyroiditis: Secondary | ICD-10-CM | POA: Insufficient documentation

## 2017-04-05 DIAGNOSIS — E039 Hypothyroidism, unspecified: Secondary | ICD-10-CM

## 2017-04-05 LAB — TSH: TSH: 0.11 mIU/L — ABNORMAL LOW (ref 0.40–4.50)

## 2017-04-05 LAB — THYROID PEROXIDASE ANTIBODY

## 2017-04-05 MED ORDER — LEVOTHYROXINE SODIUM 100 MCG PO TABS: 100.0000 ug | ORAL_TABLET | Freq: Every day | ORAL | 0 refills | Status: DC

## 2017-04-12 ENCOUNTER — Encounter: Payer: Self-pay | Admitting: Physician Assistant

## 2017-04-25 ENCOUNTER — Encounter: Payer: Self-pay | Admitting: Physician Assistant

## 2017-04-25 DIAGNOSIS — R7989 Other specified abnormal findings of blood chemistry: Secondary | ICD-10-CM

## 2017-04-29 DIAGNOSIS — R7989 Other specified abnormal findings of blood chemistry: Secondary | ICD-10-CM | POA: Diagnosis not present

## 2017-04-29 LAB — TSH: TSH: 0.69 m[IU]/L (ref 0.40–4.50)

## 2017-05-03 ENCOUNTER — Other Ambulatory Visit: Payer: Self-pay | Admitting: Physician Assistant

## 2017-05-03 DIAGNOSIS — E039 Hypothyroidism, unspecified: Secondary | ICD-10-CM

## 2017-05-14 ENCOUNTER — Encounter: Payer: Self-pay | Admitting: Physician Assistant

## 2017-05-14 ENCOUNTER — Ambulatory Visit: Payer: BLUE CROSS/BLUE SHIELD | Admitting: Physician Assistant

## 2017-05-14 VITALS — BP 119/73 | HR 44 | Ht 64.0 in | Wt 135.0 lb

## 2017-05-14 DIAGNOSIS — Z8701 Personal history of pneumonia (recurrent): Secondary | ICD-10-CM

## 2017-05-14 DIAGNOSIS — R059 Cough, unspecified: Secondary | ICD-10-CM

## 2017-05-14 DIAGNOSIS — J069 Acute upper respiratory infection, unspecified: Secondary | ICD-10-CM | POA: Diagnosis not present

## 2017-05-14 DIAGNOSIS — R062 Wheezing: Secondary | ICD-10-CM

## 2017-05-14 DIAGNOSIS — R0789 Other chest pain: Secondary | ICD-10-CM

## 2017-05-14 DIAGNOSIS — R05 Cough: Secondary | ICD-10-CM

## 2017-05-14 MED ORDER — AZITHROMYCIN 250 MG PO TABS: ORAL_TABLET | ORAL | 0 refills | Status: DC

## 2017-05-14 MED ORDER — LEVOTHYROXINE SODIUM 88 MCG PO CAPS: 88.0000 ug | ORAL_CAPSULE | ORAL | 1 refills | Status: DC

## 2017-05-14 MED ORDER — PREDNISONE 50 MG PO TABS: ORAL_TABLET | ORAL | 0 refills | Status: DC

## 2017-05-14 NOTE — Progress Notes (Signed)
Subjective:    Patient ID: Joy Golden, female    DOB: 1955-07-23, 62 y.o.   MRN: [id number]  HPI  Pt is a 62 yo female with hx of spontaneous pneumothorax who presents to the clinic with left sided mid back and chest pain and fullness. She has had UrI symptoms for the last 10 days. She has had pneumonia before and felt similar. She feels somewhat SOB. Her sinus pressure, cough, ST seems to be improving. She continues to have the ongoing dry cough and left side fullness. She denies any fever, chills, body aches. She is taking mucinex and delsym.    .. Active Ambulatory Problems    Diagnosis Date Noted  . Thyroid activity decreased 04/11/2014  . Lumbar radiculopathy 04/11/2014  . Left-sided low back pain with left-sided sciatica 04/11/2014  . Lumbar herniated disc 04/11/2014  . Seborrheic keratosis 07/18/2014  . Hyperlipidemia 02/19/2015  . Itching 03/02/2016  . Metatarsalgia of right foot 05/04/2016  . Right foot pain 08/19/2016  . Milia 03/11/2017  . Irritable bowel syndrome with constipation 03/11/2017  . Hashimoto's thyroiditis 04/05/2017   Resolved Ambulatory Problems    Diagnosis Date Noted  . Elevated LDL cholesterol level 03/08/2017   Past Medical History:  Diagnosis Date  . Arthritis   . Heart murmur   . Hypothyroidism   . Thyroid disease      Review of Systems See HPI>     Objective:   Physical Exam  Constitutional: She is oriented to person, place, and time. She appears well-developed and well-nourished.  HENT:  Head: Normocephalic and atraumatic.  Right Ear: External ear normal.  Left Ear: External ear normal.  Nose: Nose normal.  Mouth/Throat: Oropharynx is clear and moist. No oropharyngeal exudate.  Eyes: Conjunctivae are normal. Right eye exhibits no discharge. Left eye exhibits no discharge.  Neck: Normal range of motion. Neck supple.  Cardiovascular: Normal rate, regular rhythm and normal heart sounds.  Pulmonary/Chest: Effort normal.  Left lung  middle to base wheezing with some scattered crackles that were not present on the right.  Pulse ox normal.   Neurological: She is alert and oriented to person, place, and time.  Psychiatric: She has a normal mood and affect. Her behavior is normal.          Assessment & Plan:  Marland KitchenMarland KitchenCadence was seen today for cough and nasal congestion.  Diagnoses and all orders for this visit:  Wheezing -     azithromycin (ZITHROMAX) 250 MG tablet; Take 2 tablets now and then one tablet for 4 days. -     predniSONE (DELTASONE) 50 MG tablet; One tab PO daily for 5 days.  Cough -     azithromycin (ZITHROMAX) 250 MG tablet; Take 2 tablets now and then one tablet for 4 days. -     predniSONE (DELTASONE) 50 MG tablet; One tab PO daily for 5 days.  Left-sided chest wall pain -     azithromycin (ZITHROMAX) 250 MG tablet; Take 2 tablets now and then one tablet for 4 days. -     predniSONE (DELTASONE) 50 MG tablet; One tab PO daily for 5 days.  Viral upper respiratory tract infection  History of pneumonia  Other orders -     Levothyroxine Sodium 88 MCG CAPS; Take 1 capsule (88 mcg total) by mouth every other day.     Vitals reassuring;however, lung exam on left was concerning. Due to recent URI symptoms, lung sounds, and past hx of pneumonia feeling like this will  treat to cover pneumonia. zpak given with 5 days of prednisone. Discussed other symptomatic care. Follow up as needed or if symptoms worsening.

## 2017-05-16 ENCOUNTER — Encounter: Payer: Self-pay | Admitting: Physician Assistant

## 2017-05-18 ENCOUNTER — Encounter: Payer: Self-pay | Admitting: Physician Assistant

## 2017-05-18 MED ORDER — ALBUTEROL SULFATE HFA 108 (90 BASE) MCG/ACT IN AERS: 2.0000 | INHALATION_SPRAY | Freq: Four times a day (QID) | RESPIRATORY_TRACT | 1 refills | Status: DC | PRN

## 2017-07-06 ENCOUNTER — Encounter: Payer: Self-pay | Admitting: Physician Assistant

## 2017-07-06 DIAGNOSIS — E039 Hypothyroidism, unspecified: Secondary | ICD-10-CM | POA: Diagnosis not present

## 2017-07-06 LAB — TSH: TSH: 0.74 m[IU]/L (ref 0.40–4.50)

## 2017-07-06 NOTE — Telephone Encounter (Signed)
Call pt: perfect thyroid levels. Do you need refills? Follow up 6 months with lab.

## 2017-07-07 ENCOUNTER — Encounter: Payer: Self-pay | Admitting: Physician Assistant

## 2017-07-07 DIAGNOSIS — E039 Hypothyroidism, unspecified: Secondary | ICD-10-CM

## 2017-07-07 MED ORDER — LEVOTHYROXINE SODIUM 100 MCG PO TABS: 100.0000 ug | ORAL_TABLET | ORAL | 1 refills | Status: DC

## 2017-07-07 MED ORDER — LEVOTHYROXINE SODIUM 88 MCG PO CAPS: 88.0000 ug | ORAL_CAPSULE | ORAL | 1 refills | Status: DC

## 2017-07-08 ENCOUNTER — Ambulatory Visit: Payer: BLUE CROSS/BLUE SHIELD | Admitting: Physician Assistant

## 2017-07-08 ENCOUNTER — Encounter: Payer: Self-pay | Admitting: Physician Assistant

## 2017-07-08 VITALS — BP 105/68 | HR 50 | Ht 64.0 in | Wt 138.0 lb

## 2017-07-08 DIAGNOSIS — K115 Sialolithiasis: Secondary | ICD-10-CM | POA: Insufficient documentation

## 2017-07-08 NOTE — Progress Notes (Signed)
   Subjective:    Patient ID: Joy Golden, female    DOB: 03-02-1956, 62 y.o.   MRN: [id number]  HPI Joy Golden presents today with concerns about a lump that she has noticed under her tongue. She first noticed it about 1 month ago while eating. The lump has been there since she noticed. She states that it is not painful, she does not notice any secretions, has not gotten any larger in size, she denies any other symptoms that make it better or worse. She denies any fevers, chills, nausea, vomiting.   .. Active Ambulatory Problems    Diagnosis Date Noted  . Thyroid activity decreased 04/11/2014  . Lumbar radiculopathy 04/11/2014  . Left-sided low back pain with left-sided sciatica 04/11/2014  . Lumbar herniated disc 04/11/2014  . Seborrheic keratosis 07/18/2014  . Hyperlipidemia 02/19/2015  . Itching 03/02/2016  . Metatarsalgia of right foot 05/04/2016  . Right foot pain 08/19/2016  . Milia 03/11/2017  . Irritable bowel syndrome with constipation 03/11/2017  . Hashimoto's thyroiditis 04/05/2017   Resolved Ambulatory Problems    Diagnosis Date Noted  . Elevated LDL cholesterol level 03/08/2017   Past Medical History:  Diagnosis Date  . Arthritis   . Heart murmur   . Hypothyroidism   . Thyroid disease       Review of Systems  Constitutional: Negative for chills and fever.  HENT: Positive for mouth sores ( lump). Negative for congestion, postnasal drip, sinus pain and sore throat.   Respiratory: Negative for cough and shortness of breath.   Gastrointestinal: Negative for nausea and vomiting.       Objective:   Physical Exam  Constitutional: She is oriented to person, place, and time. She appears well-developed and well-nourished.  HENT:  Head: Normocephalic and atraumatic.  Right Ear: External ear normal.  Left Ear: External ear normal.  Small 1cm nodule located under the tongue at the submandibular gland that is non-erythematous, non-tender, with no drainage  Eyes:  Conjunctivae are normal.  Neck: Normal range of motion. Neck supple.  Cardiovascular: Normal rate, regular rhythm and normal heart sounds.  Pulmonary/Chest: Effort normal and breath sounds normal. She has no wheezes.  Lymphadenopathy:    She has no cervical adenopathy.  Neurological: She is alert and oriented to person, place, and time.  Skin: Skin is warm and dry.  Psychiatric: She has a normal mood and affect. Her behavior is normal.          Assessment & Plan:  Marland KitchenMarland KitchenDiagnoses and all orders for this visit:  Stone of salivary gland or duct   Appears to be a stone in the salivary gland of the submandibular duct.  Discussed this is benign pathology.  I suggested sucking on sour candy, warm compresses and massage.  If lesion increases in size or becomes painful please follow-up.  It is important to note that she does have a history of smoking.  If he were to continue to grow in size I would like to consider possible pathology.

## 2017-07-08 NOTE — Patient Instructions (Signed)
Salivary Stone A salivary stone is a mineral deposit that builds up in the ducts that drain your salivary glands. Most salivary gland stones are made of calcium. When a stone forms, saliva can back up into the gland and cause painful swelling. Your salivary glands are the glands that produce spit (saliva). You have six major salivary glands. Each gland has a duct that carries saliva into your mouth. Saliva keeps your mouth moist and breaks down the food that you eat. It also helps to prevent tooth decay. Two salivary glands are located just in front of your ears (parotid). The ducts for these glands open up inside your cheeks, near your back teeth. You also have two glands under your tongue (sublingual) and two glands under your jaw (submandibular). The ducts for these glands open under your tongue. A stone can form in any salivary gland. The most common place for a salivary stone to develop is in a submandibular salivary gland. What are the causes? Any condition that reduces the flow of saliva may lead to stone formation. It is not known why some people form stones and others do not. What increases the risk? You may be more likely to develop a salivary stone if you:  Are female.  Do not drink enough water.  Smoke.  Have high blood pressure.  Have gout.  Have diabetes.  What are the signs or symptoms? The main sign of a salivary gland stone is sudden swelling of a salivary gland when eating. This usually happens under the jaw on one side. Other signs and symptoms include:  Swelling of the cheek or under the tongue when eating.  Pain in the swollen area.  Trouble chewing or swallowing.  Swelling that goes down after eating.  How is this diagnosed? Your health care provider may diagnose a salivary gland stone based on your signs and symptoms. The health care provider will also do a physical exam. In many cases, a stone can be felt in a duct inside your mouth. You may need to see an ear,  nose, and throat specialist (ENT or otolaryngologist) for diagnosis and treatment. You may also need to have diagnostic tests. These may include imaging studies to check for a stone, such as:  X-rays.  Ultrasound.  CT scan.  MRI.  How is this treated? Home care may be enough to treat a small stone that is not causing symptoms. Treatment of a stone that is large enough to cause symptoms may include:  Probing and widening the duct to allow the stone to pass.  Inserting a thin, flexible scope (endoscope) into the duct to locate and remove the stone.  Breaking up the stone with sound waves.  Removing the entire salivary gland.  Follow these instructions at home:  Drink enough fluid to keep your urine clear or pale yellow.  Follow these instructions every few hours: ? Suck on a lemon candy to stimulate the flow of saliva. ? Put a hot compress over the gland. ? Gently massage the gland.  Do not use any tobacco products, including cigarettes, chewing tobacco, or electronic cigarettes. If you need help quitting, ask your health care provider. Contact a health care provider if:  You have pain and swelling in your face, jaw, or mouth after eating.  You have persistent swelling in any of these places: ? In front of your ear. ? Under your jaw. ? Inside your mouth. Get help right away if:  You have pain and swelling in your face,   jaw, or mouth that are getting worse.  Your pain and swelling make it hard to swallow or breathe. This information is not intended to replace advice given to you by your health care provider. Make sure you discuss any questions you have with your health care provider. Document Released: 04/30/2004 Document Revised: 08/29/2015 Document Reviewed: 08/23/2013 Elsevier Interactive Patient Education  2018 Elsevier Inc.  

## 2017-08-24 ENCOUNTER — Encounter: Payer: Self-pay | Admitting: Physician Assistant

## 2017-09-24 ENCOUNTER — Encounter: Payer: Self-pay | Admitting: Physician Assistant

## 2017-09-24 MED ORDER — HYDROXYZINE HCL 10 MG PO TABS: 10.0000 mg | ORAL_TABLET | Freq: Three times a day (TID) | ORAL | 1 refills | Status: DC | PRN

## 2017-10-24 ENCOUNTER — Encounter: Payer: Self-pay | Admitting: Physician Assistant

## 2017-10-25 DIAGNOSIS — R51 Headache: Secondary | ICD-10-CM | POA: Diagnosis not present

## 2017-10-25 DIAGNOSIS — Z8679 Personal history of other diseases of the circulatory system: Secondary | ICD-10-CM | POA: Diagnosis not present

## 2017-10-25 DIAGNOSIS — R5383 Other fatigue: Secondary | ICD-10-CM | POA: Diagnosis not present

## 2017-10-29 ENCOUNTER — Encounter: Payer: Self-pay | Admitting: Physician Assistant

## 2017-11-08 ENCOUNTER — Telehealth: Payer: Self-pay | Admitting: Physician Assistant

## 2017-11-08 ENCOUNTER — Encounter: Payer: Self-pay | Admitting: Physician Assistant

## 2017-11-08 NOTE — Telephone Encounter (Signed)
Hepatitis A is a transient infection rather than a permanent one, typically it causes GI upset and diarrhea. Would ask that she send [place] records from wherever she got this testing done, follow-up with PCP or someone else here in the office, or seek urgent care wherever she is if there are any additional questions or symptoms.  Could not really say more without seeing the patient.

## 2017-11-08 NOTE — Telephone Encounter (Signed)
My apologies, it is only Hep A. Attached labs sent via MyChart for review.

## 2017-11-08 NOTE — Telephone Encounter (Signed)
Pt called. 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  Presently she is in [place].

## 2017-11-08 NOTE — Telephone Encounter (Signed)
Pt is currently in [place]. 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 That is who ordered the lab screen for Hep B because she was symptomatic. She is going to send lab results via MyChart message. She had some blood labs done a week or so ago, those were also sent via MyChart and PCP reviewed them. She is no longer symptotic and resumed her regular daily activities. She will be in [place] until the fall, so I advised her to seek care at an UC if her symptoms return. Will route for review.

## 2017-11-08 NOTE — Telephone Encounter (Signed)
Would agree. Not much I can do from here. Hep B is a whole other problem, I got a message about Hep A. She needs an appointment!

## 2017-11-09 NOTE — Telephone Encounter (Signed)
Addressed on mychart message

## 2017-11-12 DIAGNOSIS — R109 Unspecified abdominal pain: Secondary | ICD-10-CM | POA: Diagnosis not present

## 2017-11-12 DIAGNOSIS — K588 Other irritable bowel syndrome: Secondary | ICD-10-CM | POA: Diagnosis not present

## 2017-11-12 DIAGNOSIS — R300 Dysuria: Secondary | ICD-10-CM | POA: Diagnosis not present

## 2017-11-25 ENCOUNTER — Telehealth: Payer: Self-pay | Admitting: Physician Assistant

## 2017-11-25 NOTE — Telephone Encounter (Signed)
Pt stays in [place] in the summer and here in [place] in the  fall and winter but has a question regarding her autoimmune disease and would like for Jade to give her a call at [phone]. She did schedule her f/u appointment and cpe with [name] for Nov. And December.

## 2017-11-26 ENCOUNTER — Encounter: Payer: Self-pay | Admitting: Physician Assistant

## 2018-02-08 ENCOUNTER — Ambulatory Visit: Payer: BLUE CROSS/BLUE SHIELD | Admitting: Physician Assistant

## 2018-03-14 ENCOUNTER — Telehealth: Payer: Self-pay | Admitting: Physician Assistant

## 2018-03-14 ENCOUNTER — Encounter: Payer: BLUE CROSS/BLUE SHIELD | Admitting: Physician Assistant

## 2018-03-14 DIAGNOSIS — E039 Hypothyroidism, unspecified: Secondary | ICD-10-CM

## 2018-03-14 DIAGNOSIS — E785 Hyperlipidemia, unspecified: Secondary | ICD-10-CM

## 2018-03-14 NOTE — Telephone Encounter (Signed)
Mrs. Khurana appointment needed to be pushed back due to [place] being out of the office. She needed some medication filled before the appointment.  Naproxen 500mg  Levothyroxine Sodium 88 MCG CAPS   Order Annual Blood Work, (Thyroid)

## 2018-03-16 NOTE — Telephone Encounter (Signed)
Called and spoke with patient. Patient states she does not want any refills until her appointment on 03/21/2018. I offered to give her enough to get her to her appointment date, but patient refused as she wants the whole year prescription, not "just a few pills". Patient is willing to wait until appointment date to obtain her medication refills. Patient does request to have her annual blood panel labs along with TSH labs done before her appointment time. I will put in these lab orders and will fax them downstairs to the lab. No further questions or concerns at this time.

## 2018-03-16 NOTE — Addendum Note (Signed)
Addended by: Darene Lamer on: 03/16/2018 02:56 PM   Modules accepted: Orders

## 2018-03-21 ENCOUNTER — Ambulatory Visit (INDEPENDENT_AMBULATORY_CARE_PROVIDER_SITE_OTHER): Payer: BLUE CROSS/BLUE SHIELD | Admitting: Physician Assistant

## 2018-03-21 ENCOUNTER — Encounter: Payer: Self-pay | Admitting: Physician Assistant

## 2018-03-21 VITALS — BP 111/74 | HR 46 | Ht 64.0 in | Wt 136.0 lb

## 2018-03-21 DIAGNOSIS — R21 Rash and other nonspecific skin eruption: Secondary | ICD-10-CM | POA: Diagnosis not present

## 2018-03-21 DIAGNOSIS — Z0000 Encounter for general adult medical examination without abnormal findings: Secondary | ICD-10-CM

## 2018-03-21 MED ORDER — NAPROXEN 500 MG PO TABS: 500.0000 mg | ORAL_TABLET | Freq: Three times a day (TID) | ORAL | 3 refills | Status: DC

## 2018-03-21 MED ORDER — LEVOTHYROXINE SODIUM 88 MCG PO CAPS: 88.0000 ug | ORAL_CAPSULE | ORAL | 1 refills | Status: DC

## 2018-03-21 NOTE — Patient Instructions (Signed)
Health Maintenance for Postmenopausal Women Menopause is a normal process in which your reproductive ability comes to an end. This process happens gradually over a span of months to years, usually between the ages of 22 and 9. Menopause is complete when you have missed 12 consecutive menstrual periods. It is important to talk with your health care provider about some of the most common conditions that affect postmenopausal women, such as heart disease, cancer, and bone loss (osteoporosis). Adopting a healthy lifestyle and getting preventive care can help to promote your health and wellness. Those actions can also lower your chances of developing some of these common conditions. What should I know about menopause? During menopause, you may experience a number of symptoms, such as:  Moderate-to-severe hot flashes.  Night sweats.  Decrease in sex drive.  Mood swings.  Headaches.  Tiredness.  Irritability.  Memory problems.  Insomnia.  Choosing to treat or not to treat menopausal changes is an individual decision that you make with your health care provider. What should I know about hormone replacement therapy and supplements? Hormone therapy products are effective for treating symptoms that are associated with menopause, such as hot flashes and night sweats. Hormone replacement carries certain risks, especially as you become older. If you are thinking about using estrogen or estrogen with progestin treatments, discuss the benefits and risks with your health care provider. What should I know about heart disease and stroke? Heart disease, heart attack, and stroke become more likely as you age. This may be due, in part, to the hormonal changes that your body experiences during menopause. These can affect how your body processes dietary fats, triglycerides, and cholesterol. Heart attack and stroke are both medical emergencies. There are many things that you can do to help prevent heart disease  and stroke:  Have your blood pressure checked at least every 1-2 years. High blood pressure causes heart disease and increases the risk of stroke.  If you are 53-22 years old, ask your health care provider if you should take aspirin to prevent a heart attack or a stroke.  Do not use any tobacco products, including cigarettes, chewing tobacco, or electronic cigarettes. If you need help quitting, ask your health care provider.  It is important to eat a healthy diet and maintain a healthy weight. ? Be sure to include plenty of vegetables, fruits, low-fat dairy products, and lean protein. ? Avoid eating foods that are high in solid fats, added sugars, or salt (sodium).  Get regular exercise. This is one of the most important things that you can do for your health. ? Try to exercise for at least 150 minutes each week. The type of exercise that you do should increase your heart rate and make you sweat. This is known as moderate-intensity exercise. ? Try to do strengthening exercises at least twice each week. Do these in addition to the moderate-intensity exercise.  Know your numbers.Ask your health care provider to check your cholesterol and your blood glucose. Continue to have your blood tested as directed by your health care provider.  What should I know about cancer screening? There are several types of cancer. Take the following steps to reduce your risk and to catch any cancer development as early as possible. Breast Cancer  Practice breast self-awareness. ? This means understanding how your breasts normally appear and feel. ? It also means doing regular breast self-exams. Let your health care provider know about any changes, no matter how small.  If you are 40  or older, have a clinician do a breast exam (clinical breast exam or CBE) every year. Depending on your age, family history, and medical history, it may be recommended that you also have a yearly breast X-ray (mammogram).  If you  have a family history of breast cancer, talk with your health care provider about genetic screening.  If you are at high risk for breast cancer, talk with your health care provider about having an MRI and a mammogram every year.  Breast cancer (BRCA) gene test is recommended for women who have family members with BRCA-related cancers. Results of the assessment will determine the need for genetic counseling and BRCA1 and for BRCA2 testing. BRCA-related cancers include these types: ? Breast. This occurs in males or females. ? Ovarian. ? Tubal. This may also be called fallopian tube cancer. ? Cancer of the abdominal or pelvic lining (peritoneal cancer). ? Prostate. ? Pancreatic.  Cervical, Uterine, and Ovarian Cancer Your health care provider may recommend that you be screened regularly for cancer of the pelvic organs. These include your ovaries, uterus, and vagina. This screening involves a pelvic exam, which includes checking for microscopic changes to the surface of your cervix (Pap test).  For women ages 21-65, health care providers may recommend a pelvic exam and a Pap test every three years. For women ages 79-65, they may recommend the Pap test and pelvic exam, combined with testing for human papilloma virus (HPV), every five years. Some types of HPV increase your risk of cervical cancer. Testing for HPV may also be done on women of any age who have unclear Pap test results.  Other health care providers may not recommend any screening for nonpregnant women who are considered low risk for pelvic cancer and have no symptoms. Ask your health care provider if a screening pelvic exam is right for you.  If you have had past treatment for cervical cancer or a condition that could lead to cancer, you need Pap tests and screening for cancer for at least 20 years after your treatment. If Pap tests have been discontinued for you, your risk factors (such as having a new sexual partner) need to be  reassessed to determine if you should start having screenings again. Some women have medical problems that increase the chance of getting cervical cancer. In these cases, your health care provider may recommend that you have screening and Pap tests more often.  If you have a family history of uterine cancer or ovarian cancer, talk with your health care provider about genetic screening.  If you have vaginal bleeding after reaching menopause, tell your health care provider.  There are currently no reliable tests available to screen for ovarian cancer.  Lung Cancer Lung cancer screening is recommended for adults 69-62 years old who are at high risk for lung cancer because of a history of smoking. A yearly low-dose CT scan of the lungs is recommended if you:  Currently smoke.  Have a history of at least 30 pack-years of smoking and you currently smoke or have quit within the past 15 years. A pack-year is smoking an average of one pack of cigarettes per day for one year.  Yearly screening should:  Continue until it has been 15 years since you quit.  Stop if you develop a health problem that would prevent you from having lung cancer treatment.  Colorectal Cancer  This type of cancer can be detected and can often be prevented.  Routine colorectal cancer screening usually begins at  age 42 and continues through age 45.  If you have risk factors for colon cancer, your health care provider may recommend that you be screened at an earlier age.  If you have a family history of colorectal cancer, talk with your health care provider about genetic screening.  Your health care provider may also recommend using home test kits to check for hidden blood in your stool.  A small camera at the end of a tube can be used to examine your colon directly (sigmoidoscopy or colonoscopy). This is done to check for the earliest forms of colorectal cancer.  Direct examination of the colon should be repeated every  5-10 years until age 71. However, if early forms of precancerous polyps or small growths are found or if you have a family history or genetic risk for colorectal cancer, you may need to be screened more often.  Skin Cancer  Check your skin from head to toe regularly.  Monitor any moles. Be sure to tell your health care provider: ? About any new moles or changes in moles, especially if there is a change in a mole's shape or color. ? If you have a mole that is larger than the size of a pencil eraser.  If any of your family members has a history of skin cancer, especially at a young age, talk with your health care provider about genetic screening.  Always use sunscreen. Apply sunscreen liberally and repeatedly throughout the day.  Whenever you are outside, protect yourself by wearing long sleeves, pants, a wide-brimmed hat, and sunglasses.  What should I know about osteoporosis? Osteoporosis is a condition in which bone destruction happens more quickly than new bone creation. After menopause, you may be at an increased risk for osteoporosis. To help prevent osteoporosis or the bone fractures that can happen because of osteoporosis, the following is recommended:  If you are 46-71 years old, get at least 1,000 mg of calcium and at least 600 mg of vitamin D per day.  If you are older than age 55 but younger than age 65, get at least 1,200 mg of calcium and at least 600 mg of vitamin D per day.  If you are older than age 54, get at least 1,200 mg of calcium and at least 800 mg of vitamin D per day.  Smoking and excessive alcohol intake increase the risk of osteoporosis. Eat foods that are rich in calcium and vitamin D, and do weight-bearing exercises several times each week as directed by your health care provider. What should I know about how menopause affects my mental health? Depression may occur at any age, but it is more common as you become older. Common symptoms of depression  include:  Low or sad mood.  Changes in sleep patterns.  Changes in appetite or eating patterns.  Feeling an overall lack of motivation or enjoyment of activities that you previously enjoyed.  Frequent crying spells.  Talk with your health care provider if you think that you are experiencing depression. What should I know about immunizations? It is important that you get and maintain your immunizations. These include:  Tetanus, diphtheria, and pertussis (Tdap) booster vaccine.  Influenza every year before the flu season begins.  Pneumonia vaccine.  Shingles vaccine.  Your health care provider may also recommend other immunizations. This information is not intended to replace advice given to you by your health care provider. Make sure you discuss any questions you have with your health care provider. Document Released: 05/15/2005  Document Revised: 10/11/2015 Document Reviewed: 12/25/2014 Elsevier Interactive Patient Education  2018 [employer]vier Inc.  

## 2018-03-23 ENCOUNTER — Telehealth: Payer: Self-pay

## 2018-03-23 MED ORDER — LEVOTHYROXINE SODIUM 88 MCG PO CAPS: 88.0000 ug | ORAL_CAPSULE | ORAL | 1 refills | Status: DC

## 2018-03-23 MED ORDER — NAPROXEN 500 MG PO TABS: 500.0000 mg | ORAL_TABLET | Freq: Three times a day (TID) | ORAL | 3 refills | Status: DC

## 2018-03-23 NOTE — Telephone Encounter (Signed)
Pt requesting RX for levothyroxine and naproxen be cancelled at [facility] and changed to [place].   RXs cancelled at [facility] and sent to [facility]

## 2018-03-24 DIAGNOSIS — Z0000 Encounter for general adult medical examination without abnormal findings: Secondary | ICD-10-CM | POA: Diagnosis not present

## 2018-03-24 NOTE — Progress Notes (Signed)
Subjective:     Joy Golden is a 62 y.o. female and is here for a comprehensive physical exam. The patient reports problems - she has a red scaly area on left breast. seems to be improving. .  Social History   Socioeconomic History  . Marital status: Divorced    Spouse name: Not on file  . Number of children: Not on file  . Years of education: Not on file  . Highest education level: Not on file  Occupational History  . Not on file  Social Needs  . Financial resource strain: Not on file  . Food insecurity:    Worry: Not on file    Inability: Not on file  . Transportation needs:    Medical: Not on file    Non-medical: Not on file  Tobacco Use  . Smoking status: Former Smoker    Years: 14.00  . Smokeless tobacco: Never Used  . Tobacco comment: Quit at age 63.  Substance and Sexual Activity  . Alcohol use: Yes    Alcohol/week: 7.0 standard drinks    Types: 7 Glasses of wine per week  . Drug use: No  . Sexual activity: Never  Lifestyle  . Physical activity:    Days per week: Not on file    Minutes per session: Not on file  . Stress: Not on file  Relationships  . Social connections:    Talks on phone: Not on file    Gets together: Not on file    Attends religious service: Not on file    Active member of club or organization: Not on file    Attends meetings of clubs or organizations: Not on file    Relationship status: Not on file  . Intimate partner violence:    Fear of current or ex partner: Not on file    Emotionally abused: Not on file    Physically abused: Not on file    Forced sexual activity: Not on file  Other Topics Concern  . Not on file  Social History Narrative  . Not on file   Health Maintenance  Topic Date Due  . HIV Screening  09/26/1968  . COLONOSCOPY  04/06/2014  . INFLUENZA VACCINE  03/08/2022 (Originally 11/04/2017)  . PAP SMEAR-Modifier  04/10/2024 (Originally 09/27/1974)  . MAMMOGRAM  05/12/2018  . TETANUS/TDAP  02/17/2025  . Hepatitis C  Screening  Completed    The following portions of the patient's history were reviewed and updated as appropriate: allergies, current medications, past family history, past medical history, past social history, past surgical history and problem list.  Review of Systems Pertinent items noted in HPI and remainder of comprehensive ROS otherwise negative.   Objective:    BP 111/74   Pulse (!) 46   Ht 5\' 4"  (1.626 m)   Wt 136 lb (61.7 kg)   BMI 23.34 kg/m  General appearance: alert, cooperative and appears stated age Head: Normocephalic, without obvious abnormality, atraumatic Eyes: conjunctivae/corneas clear. PERRL, EOM's intact. Fundi benign. Ears: normal TM's and external ear canals both ears Nose: Nares normal. Septum midline. Mucosa normal. No drainage or sinus tenderness. Throat: lips, mucosa, and tongue normal; teeth and gums normal Neck: no adenopathy, no carotid bruit, no JVD, supple, symmetrical, trachea midline and thyroid not enlarged, symmetric, no tenderness/mass/nodules Back: symmetric, no curvature. ROM normal. No CVA tenderness. Lungs: clear to auscultation bilaterally Breasts: normal appearance, no masses or tenderness small .23mm by .47mm erythematous are with scales around circumfurence.  Heart: regular rate  and rhythm, S1, S2 normal, no murmur, click, rub or gallop Abdomen: soft, non-tender; bowel sounds normal; no masses,  no organomegaly Extremities: extremities normal, atraumatic, no cyanosis or edema Pulses: 2+ and symmetric Skin: Skin color, texture, turgor normal. No rashes or lesions Lymph nodes: Cervical, supraclavicular, and axillary nodes normal. Neurologic: Alert and oriented X 3, normal strength and tone. Normal symmetric reflexes. Normal coordination and gait    Assessment:    Healthy female exam.      Plan:    Marland KitchenMarland KitchenArmilda was seen today for annual exam.  Diagnoses and all orders for this visit:  Routine physical examination  Other orders -      Discontinue: naproxen (NAPROSYN) 500 MG tablet; Take 1 tablet (500 mg total) by mouth 3 (three) times daily with meals. -     Discontinue: Levothyroxine Sodium 88 MCG CAPS; Take 1 capsule (88 mcg total) by mouth every other day.  .. Depression screen [facility] 2/9 03/08/2017  Decreased Interest 0  Down, Depressed, Hopeless 0  PHQ - 2 Score 0   .. Discussed 150 minutes of exercise a week.  Encouraged vitamin D 1000 units and Calcium 1300mg  or 4 servings of dairy a day.  Fasting labs up to date.  Pt declines colonoscopy until she is 53.  Mammogram up to date. Will call to get most recent.   Rash appears like some dry skin or contact dermatitis on skin use some topical steriod and see if clears up.   See After Visit Summary for Counseling Recommendations

## 2018-03-25 ENCOUNTER — Encounter: Payer: Self-pay | Admitting: Physician Assistant

## 2018-03-25 DIAGNOSIS — E039 Hypothyroidism, unspecified: Secondary | ICD-10-CM

## 2018-03-25 LAB — TSH: TSH: 7.42 mIU/L — ABNORMAL HIGH (ref 0.40–4.50)

## 2018-03-25 LAB — COMPLETE METABOLIC PANEL WITH GFR
AG Ratio: 2.3 (calc) (ref 1.0–2.5)
ALT: 18 U/L (ref 6–29)
AST: 25 U/L (ref 10–35)
Albumin: 4.4 g/dL (ref 3.6–5.1)
Alkaline phosphatase (APISO): 67 U/L (ref 33–130)
BUN: 16 mg/dL (ref 7–25)
CO2: 31 mmol/L (ref 20–32)
Calcium: 9.9 mg/dL (ref 8.6–10.4)
Chloride: 103 mmol/L (ref 98–110)
Creat: 0.95 mg/dL (ref 0.50–0.99)
GFR, Est African American: 73 mL/min/{1.73_m2} (ref >=60)
GFR, Est Non African American: 63 mL/min/{1.73_m2} (ref >=60)
GLOBULIN: 1.9 g/dL (ref 1.9–3.7)
Glucose, Bld: 92 mg/dL (ref 65–99)
Potassium: 4.3 mmol/L (ref 3.5–5.3)
SODIUM: 141 mmol/L (ref 135–146)
Total Bilirubin: 0.8 mg/dL (ref 0.2–1.2)
Total Protein: 6.3 g/dL (ref 6.1–8.1)

## 2018-03-25 LAB — LIPID PANEL
CHOL/HDL RATIO: 3.3 (calc) (ref <5.0)
Cholesterol: 240 mg/dL — ABNORMAL HIGH (ref <200)
HDL: 72 mg/dL (ref >50)
LDL Cholesterol (Calc): 152 mg/dL (calc) — ABNORMAL HIGH
Non-HDL Cholesterol (Calc): 168 mg/dL (calc) — ABNORMAL HIGH (ref <130)
Triglycerides: 67 mg/dL (ref <150)

## 2018-03-25 NOTE — Telephone Encounter (Signed)
Call pt: kidney, liver, glucose look great.  TSH is too elevated meaning we need to increase your supplementation.  I have you down as taking .54mcg 5 days and 174mcg 2 days.  Lets increase to 134mcg 4 days and 43mcg 3 days.  Does that make sense?  Recheck TSH in 6 weeks.   LDL is elevated. HDL is wonderful. ACC 10 year CV risk is 3.7 percent. Anything over 7.5 percent recommend medication you are not there yet. Continue to work on ways to lower bad cholesterol.

## 2018-05-03 ENCOUNTER — Encounter: Payer: Self-pay | Admitting: Physician Assistant

## 2018-05-10 ENCOUNTER — Other Ambulatory Visit: Payer: Self-pay

## 2018-05-10 DIAGNOSIS — E039 Hypothyroidism, unspecified: Secondary | ICD-10-CM | POA: Diagnosis not present

## 2018-05-10 LAB — TSH: TSH: 0.54 mIU/L (ref 0.40–4.50)

## 2018-05-11 ENCOUNTER — Encounter: Payer: Self-pay | Admitting: Physician Assistant

## 2018-05-11 DIAGNOSIS — E039 Hypothyroidism, unspecified: Secondary | ICD-10-CM

## 2018-05-11 NOTE — Progress Notes (Signed)
Big jump in thyroid from one month. It is in normal range though. How do you feel?

## 2018-05-12 MED ORDER — LEVOTHYROXINE SODIUM 100 MCG PO TABS: 100.0000 ug | ORAL_TABLET | ORAL | 1 refills | Status: DC

## 2018-05-12 MED ORDER — LEVOTHYROXINE SODIUM 88 MCG PO CAPS: 88.0000 ug | ORAL_CAPSULE | ORAL | 1 refills | Status: DC

## 2018-05-13 NOTE — Telephone Encounter (Signed)
Please leave [facility] for TSH up front for patient to have drawn out of town.

## 2018-05-16 ENCOUNTER — Telehealth: Payer: Self-pay | Admitting: Physician Assistant

## 2018-05-16 ENCOUNTER — Other Ambulatory Visit: Payer: Self-pay | Admitting: Physician Assistant

## 2018-05-16 NOTE — Telephone Encounter (Signed)
Pharmacy called stating pt RX for levothyroxine was needing correction, the rx was written for capsule which is much more expensive so they want to fill with tablets and also pt states she takes 5 days a week instead of every other day.

## 2018-05-17 MED ORDER — LEVOTHYROXINE SODIUM 88 MCG PO TABS: 88.0000 ug | ORAL_TABLET | Freq: Every day | ORAL | 0 refills | Status: DC

## 2018-05-17 NOTE — Telephone Encounter (Signed)
Sent tablets 

## 2018-05-17 NOTE — Addendum Note (Signed)
Addended by: Donella Stade on: 05/17/2018 12:28 PM   Modules accepted: Orders

## 2018-06-27 ENCOUNTER — Encounter: Payer: Self-pay | Admitting: Physician Assistant

## 2018-08-01 DIAGNOSIS — E039 Hypothyroidism, unspecified: Secondary | ICD-10-CM | POA: Diagnosis not present

## 2018-08-02 ENCOUNTER — Telehealth: Payer: Self-pay

## 2018-08-02 DIAGNOSIS — E063 Autoimmune thyroiditis: Secondary | ICD-10-CM

## 2018-08-02 DIAGNOSIS — E039 Hypothyroidism, unspecified: Secondary | ICD-10-CM

## 2018-08-02 LAB — TSH: TSH: 1.51 mIU/L (ref 0.40–4.50)

## 2018-08-02 MED ORDER — LEVOTHYROXINE SODIUM 88 MCG PO TABS: 88.0000 ug | ORAL_TABLET | Freq: Every day | ORAL | 0 refills | Status: DC

## 2018-08-02 NOTE — Telephone Encounter (Signed)
Thyroid levels looks really good. I like this number. Lets keep everything the same and follow up with labs in 3 months. Does she need any refills. Confirm exactly how she is taking it.

## 2018-08-02 NOTE — Telephone Encounter (Signed)
Result note:  "Notes recorded by Towana Badger, CMA on 08/02/2018 at 10:20 AM EDT Pt advised of results and recommendations. Wants labs ordered since she will be [place] of state. Patient states she wakes up in middle of night and takes one 88 mcg every night. She needs RF sent to [facility], will send refill and order labs in telephone note."  Refill sent to [facility] and Levothyroxine 119mcg removed from pt's med list.    Lab pended. [name], pt wants lab ordered mailed to [place] so she can have labs done there. Please review labs to verify they are ordered correctly, will mail patient lab slip when I return to office.

## 2018-08-09 NOTE — Telephone Encounter (Signed)
Spoke with [name], lab approved by her.  Will mail order to pt

## 2018-08-16 ENCOUNTER — Encounter: Payer: Self-pay | Admitting: Physician Assistant

## 2018-08-16 ENCOUNTER — Telehealth (INDEPENDENT_AMBULATORY_CARE_PROVIDER_SITE_OTHER): Payer: BLUE CROSS/BLUE SHIELD | Admitting: Physician Assistant

## 2018-08-16 VITALS — Ht 64.0 in | Wt 134.0 lb

## 2018-08-16 DIAGNOSIS — E063 Autoimmune thyroiditis: Secondary | ICD-10-CM

## 2018-08-16 DIAGNOSIS — Z780 Asymptomatic menopausal state: Secondary | ICD-10-CM | POA: Diagnosis not present

## 2018-08-16 DIAGNOSIS — Z820 Family history of epilepsy and other diseases of the nervous system: Secondary | ICD-10-CM | POA: Diagnosis not present

## 2018-08-16 MED ORDER — ESTROGENS CONJUGATED 0.625 MG PO TABS: 0.6250 mg | ORAL_TABLET | Freq: Every day | ORAL | 2 refills | Status: DC

## 2018-08-16 MED ORDER — PROGESTERONE MICRONIZED 100 MG PO CAPS: 100.0000 mg | ORAL_CAPSULE | Freq: Every day | ORAL | 2 refills | Status: DC

## 2018-08-16 MED ORDER — ESTRADIOL 0.5 MG PO TABS: 0.5000 mg | ORAL_TABLET | Freq: Every day | ORAL | 2 refills | Status: DC

## 2018-08-16 NOTE — Telephone Encounter (Signed)
Can we abstract this in to EMR.

## 2018-08-16 NOTE — Progress Notes (Deleted)
Patient sent testing results via mychart. She is worried about her estrogen being low. She feels tired sometimes, no other symptoms. 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 No current memory issues.

## 2018-08-16 NOTE — Telephone Encounter (Signed)
Can we abstract into med list.

## 2018-08-16 NOTE — Telephone Encounter (Signed)
Medication added to med list.

## 2018-08-16 NOTE — Telephone Encounter (Signed)
Can we abstract into medication list.

## 2018-08-16 NOTE — Telephone Encounter (Signed)
Medications added to med list.

## 2018-08-16 NOTE — Progress Notes (Signed)
Patient ID: Joy Golden, female   DOB: 03/19/1956, 63 y.o.   MRN: [id number]  .Marland KitchenVirtual Visit via Video Note  I connected with Melena Hayes on 08/17/18 at 10:50 AM EDT by a video enabled telemedicine application and verified that I am speaking with the correct person using two identifiers.  Location: Patient: home  Provider: clinic   I discussed the limitations of evaluation and management by telemedicine and the availability of in person appointments. The patient expressed understanding and agreed to proceed.  History of Present Illness: Pt is a 63 yo female with hypothyroidism who would like to discuss test result of low estrogen and progesterone and other concerns.   She went and got hormone testing done and includes results in mychart msg that showed estrogen and progesterone low. She was on hormone therapy until she was 48 but then taken off due to there family ovarian cancer history. She has really not felt good since. She really would like to be on hormone therapy despite any risk. She has been BRCA tested and does not have the gene.   She also realized that pharmacy was giving her generic levothyroxine and her levels were all over the place. She has been taking brand name and TSH been stable.   She mentions testing for alzheimer's disease. 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 She currently has no symptoms but looking for information to treat better.   .. Active Ambulatory Problems    Diagnosis Date Noted  . Thyroid activity decreased 04/11/2014  . Lumbar radiculopathy 04/11/2014  . Left-sided low back pain with left-sided sciatica 04/11/2014  . Lumbar herniated disc 04/11/2014  . Seborrheic keratosis 07/18/2014  . Hyperlipidemia 02/19/2015  . Itching 03/02/2016  . Metatarsalgia of right foot 05/04/2016  . Right foot pain 08/19/2016  . Milia 03/11/2017  . Irritable bowel syndrome with constipation 03/11/2017  . Hashimoto's thyroiditis 04/05/2017  . Stone of salivary  gland or duct 07/08/2017  . Menopause 08/16/2018  . Family history of Alzheimer's disease 08/17/2018   Resolved Ambulatory Problems    Diagnosis Date Noted  . Elevated LDL cholesterol level 03/08/2017   Past Medical History:  Diagnosis Date  . Arthritis   . Heart murmur   . Hypothyroidism   . Thyroid disease    .Marland Kitchen Family History  Problem Relation Age of Onset  . Alzheimer's disease Mother   . Cancer Sister        ovarian  . Tuberculosis Sister   . Diabetes Maternal Grandmother   . Cancer Sister        ovarian  . Alzheimer's disease Father     reviewed med, allergy, problem list.     Observations/Objective: No acute distress.  Normal mood.  Normal breathing.   .. Today's Vitals   08/16/18 1013  Weight: 134 lb (60.8 kg)  Height: 5' 4"  (1.626 m)   Body mass index is 23 kg/m.   Assessment and Plan: Marland KitchenMarland KitchenLillyian was seen today for thyroid problem.  Diagnoses and all orders for this visit:  Menopause -     estrogens, conjugated, (PREMARIN) 0.625 MG tablet; Take 1 tablet (0.625 mg total) by mouth daily. -     progesterone (PROMETRIUM) 100 MG capsule; Take 1 capsule (100 mg total) by mouth at bedtime.  Hashimoto's thyroiditis  Family history of Alzheimer's disease   Spent a long time discussing ways to prevent or prolong alzheimer. Pt is also working with a homeopathic provider for supplements. I discussed omega 3 fatty  acids, brain stimulation, coffee/caffiene extract, exercise, low sugar, good socialization.   Viewed labs deficient in estrogen and progesterone. She responded to premarin in the past sent to pharmacy with prometrium at bedtime. Will get labs in 1-2 months. Called back premarin expensive. Sent estradiol. Discussed risk of HRT with breast cancer, DVT, stroke. Pt is aware of risk. She "wants to feel better again". She has no genetic link to breast/ovarian cancer. Needs mammogram.  She is wanting to wait until she gets medicare in June.     Follow Up  Instructions:    I discussed the assessment and treatment plan with the patient. The patient was provided an opportunity to ask questions and all were answered. The patient agreed with the plan and demonstrated an understanding of the instructions.   The patient was advised to call back or seek an in-person evaluation if the symptoms worsen or if the condition fails to improve as anticipated.  I provided 25 minutes of non-face-to-face time during this encounter.   Iran Planas, PA-C

## 2018-08-17 ENCOUNTER — Encounter: Payer: Self-pay | Admitting: Physician Assistant

## 2018-08-17 DIAGNOSIS — Z820 Family history of epilepsy and other diseases of the nervous system: Secondary | ICD-10-CM | POA: Insufficient documentation

## 2018-08-22 NOTE — Telephone Encounter (Signed)
Printed to abstract.

## 2018-10-21 ENCOUNTER — Other Ambulatory Visit: Payer: Self-pay | Admitting: Physician Assistant

## 2018-10-24 LAB — TSH: TSH: 4.12 (ref 0.41–5.90)

## 2018-10-27 ENCOUNTER — Encounter: Payer: Self-pay | Admitting: Physician Assistant

## 2018-10-27 MED ORDER — ESTRADIOL 0.5 MG PO TABS: 0.5000 mg | ORAL_TABLET | Freq: Every day | ORAL | 1 refills | Status: DC

## 2018-11-11 ENCOUNTER — Telehealth: Payer: Self-pay | Admitting: Physician Assistant

## 2018-11-11 MED ORDER — LEVOTHYROXINE SODIUM 88 MCG PO TABS: 88.0000 ug | ORAL_TABLET | Freq: Every day | ORAL | 0 refills | Status: DC

## 2018-11-11 NOTE — Telephone Encounter (Signed)
Labs seen: normal thyroid range. Are you feeling good? Sent refill of levothyroxine.

## 2018-11-15 ENCOUNTER — Encounter: Payer: Self-pay | Admitting: Physician Assistant

## 2018-11-15 NOTE — Telephone Encounter (Signed)
Pt advised. Patient update was sent by pt this morning via MyChart

## 2018-11-18 MED ORDER — LEVOTHYROXINE SODIUM 100 MCG PO TABS: 100.0000 ug | ORAL_TABLET | Freq: Every day | ORAL | 0 refills | Status: DC | PRN

## 2018-11-18 NOTE — Telephone Encounter (Signed)
Please call patient: I do agree with [facility] statement.  Her TSH is slightly elevated at 4.1 and so she might feel much better if were able to get her TSH down between 1 and 2.  Lets go ahead and increase her dose to 100 mcg 3 days a week and the 88 mcg 4 days a week.  So for example she can take the 100 mcg on Monday Wednesday and Friday and take the 88 mcg the other 4 days of the week.  Repeat each week and then plan to recheck TSH in 6 weeks.  We can see if her number is closer to optimal range in addition we will see if she is feeling much better.

## 2018-11-28 MED ORDER — SYNTHROID 100 MCG PO TABS: 100.0000 ug | ORAL_TABLET | Freq: Every day | ORAL | 1 refills | Status: DC

## 2018-11-29 ENCOUNTER — Encounter: Payer: Self-pay | Admitting: Physician Assistant

## 2018-11-29 MED ORDER — SYNTHROID 88 MCG PO TABS: 88.0000 ug | ORAL_TABLET | Freq: Every day | ORAL | 1 refills | Status: DC

## 2018-11-30 ENCOUNTER — Telehealth: Payer: Self-pay | Admitting: Physician Assistant

## 2018-11-30 NOTE — Telephone Encounter (Addendum)
"  pls call pt to schedule f/u with [facility] for thyroid in about 5-6 weeks. she will just need to get her lab done at leas 3 days prior to the ppt". (Dr Jerilynn Mages.) Pt's appointment is scheduled for October 28th(back in [place] on this date [place] of [place]). She needs her lab order mailed to her at [address], [place], [place] [address](takes two weeks for labs to come back).

## 2018-11-30 NOTE — Telephone Encounter (Signed)
Labs mailed to patient. Rhonda Cunningham,CMA

## 2018-12-09 ENCOUNTER — Encounter: Payer: Self-pay | Admitting: Physician Assistant

## 2018-12-14 NOTE — Telephone Encounter (Signed)
Faxed medical records today.

## 2019-01-02 ENCOUNTER — Ambulatory Visit: Payer: BLUE CROSS/BLUE SHIELD | Admitting: Physician Assistant

## 2019-01-05 ENCOUNTER — Encounter: Payer: Self-pay | Admitting: Physician Assistant

## 2019-02-01 ENCOUNTER — Other Ambulatory Visit: Payer: Self-pay | Admitting: Physician Assistant

## 2019-02-01 ENCOUNTER — Ambulatory Visit: Payer: BLUE CROSS/BLUE SHIELD | Admitting: Physician Assistant

## 2019-02-03 ENCOUNTER — Ambulatory Visit: Payer: BLUE CROSS/BLUE SHIELD | Admitting: Physician Assistant

## 2019-02-06 ENCOUNTER — Ambulatory Visit (INDEPENDENT_AMBULATORY_CARE_PROVIDER_SITE_OTHER): Payer: Medicare Other | Admitting: Physician Assistant

## 2019-02-06 ENCOUNTER — Other Ambulatory Visit: Payer: Self-pay

## 2019-02-06 VITALS — Ht 64.0 in | Wt 133.0 lb

## 2019-02-06 DIAGNOSIS — E063 Autoimmune thyroiditis: Secondary | ICD-10-CM

## 2019-02-06 MED ORDER — LEVOXYL 100 MCG PO TABS: 100.0000 ug | ORAL_TABLET | Freq: Every day | ORAL | 0 refills | Status: DC

## 2019-02-06 NOTE — Progress Notes (Signed)
Released to me know. Order free t3 and t4.

## 2019-02-06 NOTE — Progress Notes (Deleted)
Symptoms: Freezing all the time Heart racing Tired Joy Golden 02/01/2019 downstairs to lab

## 2019-02-06 NOTE — Progress Notes (Signed)
Patient ID: Joy Golden, female   DOB: 1955/05/02, 63 y.o.   MRN: [id number] .Marland KitchenVirtual Visit via Video Note  I connected with Joy Golden on 02/07/19 at  8:30 AM EST by a video enabled telemedicine application and verified that I am speaking with the correct person using two identifiers.  Location: Patient: home Provider: clinic   I discussed the limitations of evaluation and management by telemedicine and the availability of in person appointments. The patient expressed understanding and agreed to proceed.  History of Present Illness: Pt is a 63 yo female who calls into the clinic to discuss thyroid. Her thyroid levels have been up and down for the last few years. She has tried multiple doses and cannot regulate. This last time we did try brand synthroid and levels stayed more stable but still in hypothyroid range of TSH. Pt is having hypothyroid symptoms today of fatigue, cold intolerance, skin dryness. She does not want to go hyper range which she feels much worse.   She does have endocrinology appt with Joy Golden in [place] at end of December.   .. Active Ambulatory Problems    Diagnosis Date Noted  . Thyroid activity decreased 04/11/2014  . Lumbar radiculopathy 04/11/2014  . Left-sided low back pain with left-sided sciatica 04/11/2014  . Lumbar herniated disc 04/11/2014  . Seborrheic keratosis 07/18/2014  . Hyperlipidemia 02/19/2015  . Itching 03/02/2016  . Metatarsalgia of right foot 05/04/2016  . Right foot pain 08/19/2016  . Milia 03/11/2017  . Irritable bowel syndrome with constipation 03/11/2017  . Hashimoto's thyroiditis 04/05/2017  . Stone of salivary gland or duct 07/08/2017  . Menopause 08/16/2018  . Family history of Alzheimer's disease 08/17/2018   Resolved Ambulatory Problems    Diagnosis Date Noted  . Elevated LDL cholesterol level 03/08/2017   Past Medical History:  Diagnosis Date  . Arthritis   . Heart murmur   . Hypothyroidism   . Thyroid disease          Observations/Objective: No acute distress. Normal mood and appearance.  Normal breathing and respirations.   .. Today's Vitals   02/06/19 0819  Weight: 133 lb (60.3 kg)  Height: 5\' 4"  (1.626 m)   Body mass index is 22.83 kg/m.    Assessment and Plan: Marland KitchenMarland KitchenNelsie was seen today for thyroid problem.  Diagnoses and all orders for this visit:  Hashimoto's thyroiditis -     LEVOXYL 100 MCG tablet; Take 1 tablet (100 mcg total) by mouth daily before breakfast.   I would like for her to stay on BRAND to have more stable labs. Pt is on BRAND synthroid 21mcg now. TSH is still elevated at 4.73. pt does not want to pay 50 dollars for synthroid BRAND. levoxyl BRAND good rx is 9 dollars. Sent to pharmacy and increased to 120mcg. Pt asked about cytomel and we have had previous conversations about this. If Free T4/T3 levels are appropriate will add cytomel. Recheck in 4 weeks levels. Will call and see if can get endocrinology appt sooner.   T3-low normal. Will start 5mg  of cytomel.    Follow Up Instructions:    I discussed the assessment and treatment plan with the patient. The patient was provided an opportunity to ask questions and all were answered. The patient agreed with the plan and demonstrated an understanding of the instructions.   The patient was advised to call back or seek an in-person evaluation if the symptoms worsen or if the condition fails to improve as anticipated.  Iran Planas, PA-C

## 2019-02-07 ENCOUNTER — Encounter: Payer: Self-pay | Admitting: Physician Assistant

## 2019-02-07 LAB — T4, FREE: Free T4: 1.5 ng/dL (ref 0.8–1.8)

## 2019-02-07 LAB — TEST AUTHORIZATION

## 2019-02-07 LAB — TSH: TSH: 4.73 mIU/L — ABNORMAL HIGH (ref 0.40–4.50)

## 2019-02-07 LAB — T3, FREE: T3, Free: 2.5 pg/mL (ref 2.3–4.2)

## 2019-02-07 MED ORDER — LIOTHYRONINE SODIUM 5 MCG PO TABS: 5.0000 ug | ORAL_TABLET | Freq: Every day | ORAL | 1 refills | Status: DC

## 2019-02-07 NOTE — Progress Notes (Signed)
I called [facility] they stated right now the only appointment available with Dr. Quay Burow is the one she is scheduled they have sent a message to see if Dr. Quay Burow will work her in and they will call me back. - CF

## 2019-02-07 NOTE — Progress Notes (Signed)
Joy Golden,   T3 low normal. Will start cytomel. Sent to pharmacy to take in addition to 134mcg of Levoxyl.   Recheck labs only in 4 weeks.   [name]

## 2019-02-07 NOTE — Telephone Encounter (Signed)
Thank you :)

## 2019-02-14 NOTE — Progress Notes (Signed)
Ok thanks. Sounds good.

## 2019-03-03 ENCOUNTER — Encounter: Payer: Self-pay | Admitting: Physician Assistant

## 2019-03-03 DIAGNOSIS — E063 Autoimmune thyroiditis: Secondary | ICD-10-CM

## 2019-03-06 LAB — TSH: TSH: 0.58 mIU/L (ref 0.40–4.50)

## 2019-03-06 LAB — T3, FREE: T3, Free: 3.4 pg/mL (ref 2.3–4.2)

## 2019-03-06 LAB — T4, FREE: Free T4: 1.6 ng/dL (ref 0.8–1.8)

## 2019-03-06 NOTE — Telephone Encounter (Signed)
Done

## 2019-03-06 NOTE — Telephone Encounter (Signed)
Joy Golden   there is not a referral for Endocrinology for this patient as soon as one is placed I will be sure to send all notes and labs.   [name]

## 2019-03-06 NOTE — Telephone Encounter (Signed)
Information was sent to Dr. Quay Burow office as requested. - CF

## 2019-03-06 NOTE — Addendum Note (Signed)
Addended byAnnamaria Helling on: 03/06/2019 10:09 AM   Modules accepted: Orders

## 2019-03-06 NOTE — Telephone Encounter (Signed)
[name] - can you make sure notes are sent to Endocrinology for referral. Thanks.

## 2019-03-07 ENCOUNTER — Encounter: Payer: Self-pay | Admitting: Physician Assistant

## 2019-03-07 MED ORDER — LEVOXYL 88 MCG PO TABS: 88.0000 ug | ORAL_TABLET | Freq: Every day | ORAL | 1 refills | Status: DC

## 2019-03-07 NOTE — Telephone Encounter (Signed)
TSH pretty big jump from 1 month ago more towards HYPER thyroid. Free levels look good. Still in normal range. How do you feel?

## 2019-03-07 NOTE — Telephone Encounter (Signed)
Confirm information sent to Dr. Quay Burow.

## 2019-03-08 ENCOUNTER — Encounter: Payer: Self-pay | Admitting: Physician Assistant

## 2019-03-08 ENCOUNTER — Other Ambulatory Visit: Payer: Self-pay | Admitting: *Deleted

## 2019-03-08 MED ORDER — LEVOTHYROXINE SODIUM 88 MCG PO TABS: 88.0000 ug | ORAL_TABLET | Freq: Every day | ORAL | 0 refills | Status: DC

## 2019-03-08 NOTE — Telephone Encounter (Signed)
Joy Golden   I sent patient's ov notes and labs on 03/06/19 to Dr. Eilleen Kempf office and received confirmation transaction that it was sent. - CF

## 2019-03-08 NOTE — Telephone Encounter (Signed)
Thanks

## 2019-05-06 ENCOUNTER — Other Ambulatory Visit: Payer: Self-pay | Admitting: Physician Assistant

## 2019-06-05 ENCOUNTER — Other Ambulatory Visit: Payer: Self-pay

## 2019-06-05 DIAGNOSIS — E039 Hypothyroidism, unspecified: Secondary | ICD-10-CM

## 2019-06-07 LAB — TSH: TSH: 0.68 mIU/L (ref 0.40–4.50)

## 2019-06-08 ENCOUNTER — Encounter: Payer: Self-pay | Admitting: Physician Assistant

## 2019-06-08 DIAGNOSIS — Z131 Encounter for screening for diabetes mellitus: Secondary | ICD-10-CM

## 2019-06-08 DIAGNOSIS — Z1231 Encounter for screening mammogram for malignant neoplasm of breast: Secondary | ICD-10-CM

## 2019-06-08 DIAGNOSIS — E785 Hyperlipidemia, unspecified: Secondary | ICD-10-CM

## 2019-06-08 DIAGNOSIS — Z1211 Encounter for screening for malignant neoplasm of colon: Secondary | ICD-10-CM

## 2019-06-08 DIAGNOSIS — Z0000 Encounter for general adult medical examination without abnormal findings: Secondary | ICD-10-CM

## 2019-06-08 NOTE — Progress Notes (Signed)
Thyroid in normal range. Lower end of normal meaning more HYPER thyroid. How do you feel?

## 2019-06-08 NOTE — Telephone Encounter (Signed)
Patient called and left vm that she would like orders sent before her visit next week for mammogram, cologuard, and labs. She declined DEXA scan. Ordered studies and CMP/Lipids. If need another other labs please add.

## 2019-06-09 NOTE — Telephone Encounter (Signed)
Cologuard order faxed to [phone] with confirmation received. They will contact the patient directly.   

## 2019-06-13 ENCOUNTER — Ambulatory Visit (INDEPENDENT_AMBULATORY_CARE_PROVIDER_SITE_OTHER): Payer: Medicare Other | Admitting: Physician Assistant

## 2019-06-13 ENCOUNTER — Other Ambulatory Visit: Payer: Self-pay

## 2019-06-13 VITALS — BP 124/76 | HR 48 | Ht 64.0 in | Wt 127.0 lb

## 2019-06-13 DIAGNOSIS — Z0000 Encounter for general adult medical examination without abnormal findings: Secondary | ICD-10-CM | POA: Diagnosis not present

## 2019-06-13 DIAGNOSIS — E063 Autoimmune thyroiditis: Secondary | ICD-10-CM

## 2019-06-13 DIAGNOSIS — Z1388 Encounter for screening for disorder due to exposure to contaminants: Secondary | ICD-10-CM

## 2019-06-13 DIAGNOSIS — Z23 Encounter for immunization: Secondary | ICD-10-CM | POA: Diagnosis not present

## 2019-06-13 DIAGNOSIS — N951 Menopausal and female climacteric states: Secondary | ICD-10-CM

## 2019-06-13 DIAGNOSIS — E559 Vitamin D deficiency, unspecified: Secondary | ICD-10-CM

## 2019-06-13 DIAGNOSIS — Z131 Encounter for screening for diabetes mellitus: Secondary | ICD-10-CM | POA: Diagnosis not present

## 2019-06-13 DIAGNOSIS — Z13220 Encounter for screening for lipoid disorders: Secondary | ICD-10-CM

## 2019-06-13 DIAGNOSIS — T561X1A Toxic effect of mercury and its compounds, accidental (unintentional), initial encounter: Secondary | ICD-10-CM

## 2019-06-13 DIAGNOSIS — Z780 Asymptomatic menopausal state: Secondary | ICD-10-CM

## 2019-06-13 MED ORDER — LEVOTHYROXINE SODIUM 88 MCG PO TABS: 88.0000 ug | ORAL_TABLET | Freq: Every day | ORAL | 0 refills | Status: DC

## 2019-06-13 MED ORDER — ESTRADIOL 0.5 MG PO TABS: 0.5000 mg | ORAL_TABLET | Freq: Every day | ORAL | 3 refills | Status: DC

## 2019-06-13 NOTE — Progress Notes (Signed)
Subjective:   Joy Golden is a 65 y.o. female who presents for an Initial Medicare Annual Wellness Visit.  Review of Systems    Right knee pain after knee replacement 15ish years ago intermittently.         Objective:    Today's Vitals   06/13/19 1025  BP: 124/76  Pulse: (!) 48  SpO2: 100%  Weight: 127 lb (57.6 kg)  Height: 5\' 4"  (1.626 m)   Body mass index is 21.8 kg/m.  Advanced Directives 04/09/2014  Does Patient Have a Medical Advance Directive? No  Would patient like information on creating a medical advance directive? No - patient declined information    Current Medications (verified) Outpatient Encounter Medications as of 06/13/2019  Medication Sig  . albuterol (PROVENTIL HFA;VENTOLIN HFA) 108 (90 Base) MCG/ACT inhaler Inhale 2 puffs into the lungs every 6 (six) hours as needed for wheezing or shortness of breath.  Marland Kitchen b complex vitamins tablet Take 1 tablet by mouth daily. Bio B  . estradiol (ESTRACE) 0.5 MG tablet Take 1 tablet (0.5 mg total) by mouth daily.  . fexofenadine (ALLEGRA) 180 MG tablet Take 180 mg by mouth daily.  Marland Kitchen guaiFENesin (MUCINEX) 600 MG 12 hr tablet Take by mouth 2 (two) times daily.  . hydrOXYzine (ATARAX/VISTARIL) 10 MG tablet Take 1 tablet (10 mg total) by mouth 3 (three) times daily as needed.  . hyoscyamine (LEVBID) 0.375 MG 12 hr tablet Take 1 tablet (0.375 mg total) by mouth every 12 (twelve) hours as needed.  Marland Kitchen levothyroxine (SYNTHROID) 88 MCG tablet Take 1 tablet (88 mcg total) by mouth daily.  Marland Kitchen LEVOXYL 100 MCG tablet Take 1 tablet (100 mcg total) by mouth daily before breakfast.  . Multiple Vitamin (MULTIVITAMIN) tablet Take 1 tablet by mouth daily.  . naproxen (NAPROSYN) 500 MG tablet Take 1 tablet (500 mg total) by mouth 3 (three) times daily with meals.  . Omega-3 Fatty Acids (OMEGA-3 CF PO) Take by mouth.  . Probiotic Product (PROBIOTIC PO) Take by mouth. Strengtia  . triamcinolone cream (KENALOG) 0.1 % Apply 1 application topically  2 (two) times daily.  . [DISCONTINUED] estradiol (ESTRACE) 0.5 MG tablet Take 1 tablet by mouth once daily  . [DISCONTINUED] levothyroxine (SYNTHROID) 88 MCG tablet Take 1 tablet (88 mcg total) by mouth daily. Alternate with 157mcg, 5 days a week.  . [DISCONTINUED] liothyronine (CYTOMEL) 5 MCG tablet Take 1 tablet (5 mcg total) by mouth daily.   No facility-administered encounter medications on file as of 06/13/2019.    Allergies (verified) Mobic [meloxicam]   History: Past Medical History:  Diagnosis Date  . Arthritis    Osteoarthritis  . Heart murmur    some say yes, some say no  . Hypothyroidism   . Thyroid disease    Past Surgical History:  Procedure Laterality Date  . ABDOMINAL HYSTERECTOMY  1983  . AUGMENTATION MAMMAPLASTY    . BACK SURGERY  2002   L4-L5 laminectomy  . BREAST ENHANCEMENT SURGERY  2005  . CARPAL TUNNEL RELEASE Right 2010  . CARPAL TUNNEL WITH CUBITAL TUNNEL Left   . DE QUERVAIN'S RELEASE Right   . ELBOW SURGERY Left 2013  . GANGLION CYST EXCISION Right 2012  . HERNIA REPAIR  1970/1980   4 surgeries- abdominal  . PARTIAL HYSTERECTOMY  1976  . REMOVAL OF IMPLANT Right 08/21/2016   RT FOOT  . REPLACEMENT TOTAL KNEE Right 2009  . SACROILIAC JOINT FUSION Left 06/19/2014   Procedure: SACROILIAC JOINT FUSION;  Surgeon:  Phylliss Bob, MD;  Location: [facility];  Service: Orthopedics;  Laterality: Left;  Left sided sacroiliac joint fusion  . TOE SURGERY Right    great toe horse stepped on it  . TONSILLECTOMY  1967  . TRIGGER FINGER RELEASE Right    thumb   Family History  Problem Relation Age of Onset  . Alzheimer's disease Mother   . Cancer Sister        ovarian  . Tuberculosis Sister   . Diabetes Maternal Grandmother   . Cancer Sister        ovarian  . Alzheimer's disease Father    Social History   Socioeconomic History  . Marital status: Divorced    Spouse name: Not on file  . Number of children: Not on file  . Years of education: Not on file   . Highest education level: Not on file  Occupational History  . Not on file  Tobacco Use  . Smoking status: Former Smoker    Years: 14.00  . Smokeless tobacco: Never Used  . Tobacco comment: Quit at age 66.  Substance and Sexual Activity  . Alcohol use: Yes    Alcohol/week: 7.0 standard drinks    Types: 7 Glasses of wine per week  . Drug use: No  . Sexual activity: Never  Other Topics Concern  . Not on file  Social History Narrative  . Not on file   Social Determinants of Health   Financial Resource Strain:   . Difficulty of Paying Living Expenses: Not on file  Food Insecurity:   . Worried About Charity fundraiser in the Last Year: Not on file  . Ran Out of Food in the Last Year: Not on file  Transportation Needs:   . Lack of Transportation (Medical): Not on file  . Lack of Transportation (Non-Medical): Not on file  Physical Activity:   . Days of Exercise per Week: Not on file  . Minutes of Exercise per Session: Not on file  Stress:   . Feeling of Stress : Not on file  Social Connections:   . Frequency of Communication with Friends and Family: Not on file  . Frequency of Social Gatherings with Friends and Family: Not on file  . Attends Religious Services: Not on file  . Active Member of Clubs or Organizations: Not on file  . Attends Archivist Meetings: Not on file  . Marital Status: Not on file    Tobacco Counseling Counseling given: Not Answered Comment: Quit at age 68.   Clinical Intake:                        Activities of Daily Living In your present state of health, do you have any difficulty performing the following activities: 06/13/2019  Hearing? N  Vision? N  Difficulty concentrating or making decisions? N  Walking or climbing stairs? N  Dressing or bathing? N  Doing errands, shopping? N  Some recent data might be hidden     Immunizations and Health Maintenance Immunization History  Administered Date(s) Administered   . PFIZER SARS-COV-2 Vaccination 05/03/2019, 05/24/2019  . Pneumococcal Polysaccharide-23 06/13/2019  . Tdap 02/18/2015   Health Maintenance Due  Topic Date Due  . MAMMOGRAM  05/12/2018    Patient Care Team: Lavada Mesi as PCP - General (Family Medicine)  Indicate any recent Medical Services you may have received from other than [facility] providers in the past year (date may be approximate).  Assessment:   This is a routine wellness examination for Joy Golden.  Hearing/Vision screen No exam data present  Dietary issues and exercise activities discussed:    Goals   None    Depression Screen PHQ 2/9 Scores 06/13/2019 03/08/2017  PHQ - 2 Score 0 0  PHQ- 9 Score 1 -    Fall Risk Fall Risk  06/13/2019 06/13/2019  Falls in the past year? 0 0  Number falls in past yr: 0 -  Injury with Fall? 0 -  Follow up Falls evaluation completed -    Is the patient's home free of loose throw rugs in walkways, pet beds, electrical cords, etc?   no      Grab bars in the bathroom? no      Handrails on the stairs?   yes      Adequate lighting?   yes  Timed Get Up and Go Performed   Cognitive Function:     6CIT Screen 06/13/2019  What Year? 0 points  What month? 0 points  What time? 0 points  Count back from 20 0 points  Months in reverse 0 points  Repeat phrase 0 points  Total Score 0    Screening Tests Health Maintenance  Topic Date Due  . MAMMOGRAM  05/12/2018  . DEXA SCAN  06/12/2020 (Originally 09/27/2018)  . COLONOSCOPY  06/12/2020 (Originally 04/06/2014)  . HIV Screening  06/12/2020 (Originally 09/26/1970)  . INFLUENZA VACCINE  03/08/2022 (Originally 11/05/2018)  . PAP SMEAR-Modifier  04/10/2024 (Originally 09/27/1974)  . PNA vac Low Risk Adult (2 of 2 - PCV13) 06/12/2020  . TETANUS/TDAP  02/17/2025  . Hepatitis C Screening  Completed    Qualifies for Shingles Vaccine? Pt declined.   Cancer Screenings: Lung: Low Dose CT Chest recommended if Age 86-80 years, 30  pack-year currently smoking OR have quit w/in 15years. Patient does not qualify. Breast: Up to date on Mammogram? Yes   Up to date of Bone Density/Dexa? No pt declined.  Colorectal: pt declined.   Additional Screenings: Hepatitis C Screening:      Plan:       I have personally reviewed and noted the following in the patient's chart:   . Medical and social history . Use of alcohol, tobacco or illicit drugs  . Current medications and supplements . Functional ability and status . Nutritional status . Physical activity . Advanced directives . List of other physicians . Hospitalizations, surgeries, and ER visits in previous 12 months . Vitals . Screenings to include cognitive, depression, and falls . Referrals and appointments  Pt declined shingles/colonoscopy. She agreed to cologuard which was ordered.   Marland KitchenManuela Schwartz was seen today for medicare wellness.  Diagnoses and all orders for this visit:  Medicare annual wellness visit, initial  Need for pneumococcal vaccination -     Pneumococcal polysaccharide vaccine 23-valent greater than or equal to 2yo subcutaneous/IM  Screening for diabetes mellitus -     COMPLETE METABOLIC PANEL WITH GFR  Screening for lipid disorders -     Lipid Panel w/reflex Direct LDL  Post-menopausal -     estradiol (ESTRACE) 0.5 MG tablet; Take 1 tablet (0.5 mg total) by mouth daily. -     VITAMIN D 25 Hydroxy (Vit-D Deficiency, Fractures)  Toxic effect of mercury -     Heavy Metals Panel, Blood  Heavy metal poison. screen -     Heavy Metals Panel, Blood  Vitamin D insufficiency -     VITAMIN D 25 Hydroxy (Vit-D Deficiency, Fractures)  Hashimoto's thyroiditis -     levothyroxine (SYNTHROID) 88 MCG tablet; Take 1 tablet (88 mcg total) by mouth daily. -     TSH  Encounter for Medicare annual wellness exam  Symptomatic menopausal or female climacteric states -     estradiol (ESTRACE) 0.5 MG tablet; Take 1 tablet (0.5 mg total) by mouth  daily.   Fasting labs ordered.  TSH to be checked in 6-8 weeks after dose change. Pt is taking 6 .88 and 1 dose of 100.   Consider follow up with ortho to check out hardware of right knee.   In addition, I have reviewed and discussed with patient certain preventive protocols, quality metrics, and best practice recommendations. A written personalized care plan for preventive services as well as general preventive health recommendations were provided to patient.     Iran Planas, PA-C   06/13/2019

## 2019-06-13 NOTE — Patient Instructions (Signed)

## 2019-06-14 ENCOUNTER — Encounter: Payer: Self-pay | Admitting: Physician Assistant

## 2019-06-20 NOTE — Progress Notes (Signed)
[name],   Cholesterol looks MUCH better.  Vitamin D is GREAT. You are on the upper limits of normal. Don't take any more than that but if you feel fine ok to stay on that dose.  Kidney and liver look great.  Glucose perfect.  Protein a little low. Make sure eating enough protein in your diet. Protein dropping is a sign of malnutrition.   [name]

## 2019-06-21 ENCOUNTER — Ambulatory Visit (INDEPENDENT_AMBULATORY_CARE_PROVIDER_SITE_OTHER): Payer: Medicare Other

## 2019-06-21 ENCOUNTER — Other Ambulatory Visit: Payer: Self-pay

## 2019-06-21 DIAGNOSIS — Z1231 Encounter for screening mammogram for malignant neoplasm of breast: Secondary | ICD-10-CM | POA: Diagnosis not present

## 2019-06-21 LAB — COMPLETE METABOLIC PANEL WITH GFR
AG Ratio: 1.7 (calc) (ref 1.0–2.5)
ALT: 19 U/L (ref 6–29)
AST: 23 U/L (ref 10–35)
Albumin: 3.7 g/dL (ref 3.6–5.1)
Alkaline phosphatase (APISO): 52 U/L (ref 37–153)
BUN: 14 mg/dL (ref 7–25)
CO2: 31 mmol/L (ref 20–32)
Calcium: 9.1 mg/dL (ref 8.6–10.4)
Chloride: 105 mmol/L (ref 98–110)
Creat: 0.92 mg/dL (ref 0.50–0.99)
GFR, Est African American: 76 mL/min/{1.73_m2} (ref >=60)
GFR, Est Non African American: 65 mL/min/{1.73_m2} (ref >=60)
Globulin: 2.2 g/dL (calc) (ref 1.9–3.7)
Glucose, Bld: 94 mg/dL (ref 65–99)
Potassium: 4.1 mmol/L (ref 3.5–5.3)
Sodium: 140 mmol/L (ref 135–146)
Total Bilirubin: 0.6 mg/dL (ref 0.2–1.2)
Total Protein: 5.9 g/dL — ABNORMAL LOW (ref 6.1–8.1)

## 2019-06-21 LAB — HEAVY METALS PANEL, BLOOD
Arsenic: <10 mcg/L (ref <23)
Lead: 1 ug/dL (ref <5)
Mercury, B: <5 mcg/L (ref 0–10)

## 2019-06-21 LAB — LIPID PANEL W/REFLEX DIRECT LDL
Cholesterol: 196 mg/dL (ref <200)
HDL: 71 mg/dL (ref >=50)
LDL Cholesterol (Calc): 105 mg/dL (calc) — ABNORMAL HIGH
Non-HDL Cholesterol (Calc): 125 mg/dL (calc) (ref <130)
Total CHOL/HDL Ratio: 2.8 (calc) (ref <5.0)
Triglycerides: 103 mg/dL (ref <150)

## 2019-06-21 LAB — VITAMIN D 25 HYDROXY (VIT D DEFICIENCY, FRACTURES): Vit D, 25-Hydroxy: 95 ng/mL (ref 30–100)

## 2019-06-21 NOTE — Progress Notes (Signed)
Normal mammogram. Follow up in 1 year.

## 2019-06-22 NOTE — Progress Notes (Signed)
Cayliana,   Metal panel looks normal.

## 2019-06-30 ENCOUNTER — Encounter: Payer: Self-pay | Admitting: Physician Assistant

## 2019-06-30 DIAGNOSIS — R195 Other fecal abnormalities: Secondary | ICD-10-CM | POA: Insufficient documentation

## 2019-06-30 LAB — COLOGUARD: Cologuard: POSITIVE — AB

## 2019-07-03 ENCOUNTER — Telehealth: Payer: Self-pay | Admitting: Neurology

## 2019-07-03 DIAGNOSIS — R195 Other fecal abnormalities: Secondary | ICD-10-CM

## 2019-07-03 NOTE — Telephone Encounter (Signed)
Received positive Cologuard. Patient made aware and made aware of need for Colonoscopy. She states she is worried about insurance coverage. I let her know that with positive cologuard it shouldn't be an issue getting this covered, but would leave authorization up to GI. She agreed to referral to meet with GI doctor. Referral placed.

## 2019-07-04 ENCOUNTER — Encounter: Payer: Self-pay | Admitting: Physician Assistant

## 2019-07-10 ENCOUNTER — Encounter: Payer: Self-pay | Admitting: Physician Assistant

## 2019-07-10 MED ORDER — PB-HYOSCY-ATROPINE-SCOPOLAMINE 16.2 MG PO TABS: ORAL_TABLET | ORAL | 1 refills | Status: DC

## 2019-07-11 ENCOUNTER — Ambulatory Visit (INDEPENDENT_AMBULATORY_CARE_PROVIDER_SITE_OTHER): Payer: Medicare Other | Admitting: Physician Assistant

## 2019-07-11 ENCOUNTER — Encounter: Payer: Self-pay | Admitting: Physician Assistant

## 2019-07-11 VITALS — BP 92/68 | HR 52 | Temp 98.1°F | Ht 63.25 in | Wt 127.0 lb

## 2019-07-11 DIAGNOSIS — K582 Mixed irritable bowel syndrome: Secondary | ICD-10-CM | POA: Diagnosis not present

## 2019-07-11 DIAGNOSIS — R195 Other fecal abnormalities: Secondary | ICD-10-CM | POA: Diagnosis not present

## 2019-07-11 MED ORDER — DICYCLOMINE HCL 10 MG PO CAPS: 20.0000 mg | ORAL_CAPSULE | Freq: Four times a day (QID) | ORAL | 3 refills | Status: DC | PRN

## 2019-07-11 NOTE — Progress Notes (Signed)
Reviewed and agree with management plans. ? ?Berkley Wrightsman L. Siddharth Babington, MD, MPH  ?

## 2019-07-11 NOTE — Progress Notes (Signed)
Chief Complaint: Positive Cologuard, history of IBS  HPI:    Joy Golden is a 64 year old Caucasian female with a past medical history as listed below including spastic colon, who was referred to me by Donella Stade, PA-C for a complaint of positive Cologuard and history of IBS.      06/30/2019 had a Cologuard returned positive.    Today, the patient presents to clinic and tells me that in her 41s she had a colonoscopy and was diagnosed with "spastic colon", at that time she would use Donnatal as needed for her IBS which was related to stress.  During her typical flares she will become constipated and then have some diarrhea and have severe left lower quadrant pain.  The Donnatal worked like a dream but then she could not buy it anymore.  They switched her to Hyoscyamine which she uses every 12 hours but this "does not even touch it".  Will have an episode maybe once every couple months.    Social history positive for having both of her Covid vaccines the last more than 2 weeks ago.  Also got a new puppy during the pandemic.    Denies fever, chills, blood in her stool, family history of colon cancer, weight loss or symptoms that awaken her from sleep.  Past Medical History:  Diagnosis Date  . Anxiety   . Arthritis    Osteoarthritis  . Heart murmur    some say yes, some say no  . HLD (hyperlipidemia)   . Hypothyroidism   . Spastic colon     Past Surgical History:  Procedure Laterality Date  . ABDOMINAL HYSTERECTOMY  1983  . AUGMENTATION MAMMAPLASTY    . BACK SURGERY  2002   L4-L5 laminectomy  . BREAST ENHANCEMENT SURGERY  2005  . CARPAL TUNNEL RELEASE Right 2010  . CARPAL TUNNEL WITH CUBITAL TUNNEL Left   . DE QUERVAIN'S RELEASE Right   . ELBOW SURGERY Left 2013  . GANGLION CYST EXCISION Right 2012  . HARDWARE REMOVAL Right    foot  . HERNIA REPAIR  1970/1980   4 surgeries- abdominal  . PARTIAL HYSTERECTOMY  1976  . REMOVAL OF IMPLANT Right 08/21/2016   RT FOOT  .  REPLACEMENT TOTAL KNEE Right 2009  . SACROILIAC JOINT FUSION Left 06/19/2014   Procedure: SACROILIAC JOINT FUSION;  Surgeon: Phylliss Bob, MD;  Location: [facility];  Service: Orthopedics;  Laterality: Left;  Left sided sacroiliac joint fusion  . TOE SURGERY Right    great toe horse stepped on it  . TONSILLECTOMY  1967  . TRIGGER FINGER RELEASE Right    thumb    Current Outpatient Medications  Medication Sig Dispense Refill  . albuterol (PROVENTIL HFA;VENTOLIN HFA) 108 (90 Base) MCG/ACT inhaler Inhale 2 puffs into the lungs every 6 (six) hours as needed for wheezing or shortness of breath. 1 Inhaler 1  . AMBULATORY NON FORMULARY MEDICATION Bacoba Monieri 1000 mg daily    . b complex vitamins tablet Take 1 tablet by mouth daily. Bio B    . belladona alk-PHENObarbital (DONNATAL) 16.2 MG tablet Take 1-2 tablets every 6-8 hours as needed. 120 tablet 1  . CITICOLINE PO Take 1 capsule by mouth daily.    . diphenhydrAMINE (BENADRYL) 25 MG tablet Take 50 mg by mouth at bedtime as needed.    Marland Kitchen estradiol (ESTRACE) 0.5 MG tablet Take 1 tablet (0.5 mg total) by mouth daily. 90 tablet 3  . Green Coffee Bean-Yerba Mate (GREEN COFFEE BEAN  EXTRACT PO) Take 1 capsule by mouth daily.    Marland Kitchen guaiFENesin (MUCINEX) 600 MG 12 hr tablet Take by mouth 2 (two) times daily.    . hydrOXYzine (ATARAX/VISTARIL) 10 MG tablet Take 1 tablet (10 mg total) by mouth 3 (three) times daily as needed. 90 tablet 1  . hyoscyamine (LEVBID) 0.375 MG 12 hr tablet Take 1 tablet (0.375 mg total) by mouth every 12 (twelve) hours as needed. 60 tablet 0  . levothyroxine (SYNTHROID) 88 MCG tablet Take 1 tablet (88 mcg total) by mouth daily. 90 tablet 0  . LEVOXYL 100 MCG tablet Take 1 tablet (100 mcg total) by mouth daily before breakfast. 30 tablet 0  . Multiple Vitamin (MULTIVITAMIN) tablet Take 1 tablet by mouth daily.    . naproxen (NAPROSYN) 500 MG tablet Take 1 tablet (500 mg total) by mouth 3 (three) times daily with meals. 270 tablet 3    . Omega-3 Fatty Acids (OMEGA-3 CF PO) Take by mouth.    . Probiotic Product (PROBIOTIC PO) Take by mouth. Strengtia    . Pseudoephedrine HCl (SUDAFED PO) Take by mouth as needed.    . triamcinolone cream (KENALOG) 0.1 % Apply 1 application topically 2 (two) times daily. 60 g 0   No current facility-administered medications for this visit.    Allergies as of 07/11/2019 - Review Complete 07/11/2019  Allergen Reaction Noted  . Mobic [meloxicam]  04/23/2014    Family History  Problem Relation Age of Onset  . Alzheimer's disease Mother   . Diverticulitis Mother   . Tuberculosis Sister   . Ovarian cancer Sister   . Diabetes Maternal Grandmother   . Ovarian cancer Sister   . Alzheimer's disease Father   . Crohn's disease Son   . Irritable bowel syndrome Son   . Diabetes Granddaughter     Social History   Socioeconomic History  . Marital status: Divorced    Spouse name: Not on file  . Number of children: 2  . Years of education: Not on file  . Highest education level: Not on file  Occupational History  . Occupation: retired  Tobacco Use  . Smoking status: Former Smoker    Years: 14.00    Types: Cigarettes    Quit date: 1983    Years since quitting: 38.2  . Smokeless tobacco: Never Used  . Tobacco comment: Quit at age 26.  Substance and Sexual Activity  . Alcohol use: Yes    Alcohol/week: 7.0 standard drinks    Types: 7 Glasses of wine per week    Comment: 2 per day  . Drug use: No  . Sexual activity: Never  Other Topics Concern  . Not on file  Social History Narrative  . Not on file   Social Determinants of Health   Financial Resource Strain:   . Difficulty of Paying Living Expenses:   Food Insecurity:   . Worried About Charity fundraiser in the Last Year:   . Arboriculturist in the Last Year:   Transportation Needs:   . Film/video editor (Medical):   Marland Kitchen Lack of Transportation (Non-Medical):   Physical Activity:   . Days of Exercise per Week:   .  Minutes of Exercise per Session:   Stress:   . Feeling of Stress :   Social Connections:   . Frequency of Communication with Friends and Family:   . Frequency of Social Gatherings with Friends and Family:   . Attends Religious Services:   .  Active Member of Clubs or Organizations:   . Attends Archivist Meetings:   Marland Kitchen Marital Status:   Intimate Partner Violence:   . Fear of Current or Ex-Partner:   . Emotionally Abused:   Marland Kitchen Physically Abused:   . Sexually Abused:     Review of Systems:    Constitutional: No weight loss, fever or chills Skin: No rash  Cardiovascular: No chest pain Respiratory: No SOB  Gastrointestinal: See HPI and otherwise negative Genitourinary: No dysuria  Neurological: No headache Musculoskeletal: No new muscle or joint pain Hematologic: No bleeding  Psychiatric: No history of depression or anxiety   Physical Exam:  Vital signs: BP 92/68 (BP Location: Left Arm, Patient Position: Sitting, Cuff Size: Normal)   Pulse (!) 52   Temp 98.1 F (36.7 C)   Ht 5' 3.25" (1.607 m) Comment: height measured without shoes  Wt 127 lb (57.6 kg)   BMI 22.32 kg/m   Constitutional:   Pleasant Caucasian female appears to be in NAD, Well developed, Well nourished, alert and cooperative Head:  Normocephalic and atraumatic. Eyes:   PEERL, EOMI. No icterus. Conjunctiva pink. Ears:  Normal auditory acuity. Neck:  Supple Throat: Oral cavity and pharynx without inflammation, swelling or lesion.  Respiratory: Respirations even and unlabored. Lungs clear to auscultation bilaterally.   No wheezes, crackles, or rhonchi.  Cardiovascular: Normal S1, S2. No MRG. Regular rate and rhythm. No peripheral edema, cyanosis or pallor.  Gastrointestinal:  Soft, nondistended, nontender. No rebound or guarding. Normal bowel sounds. No appreciable masses or hepatomegaly. Rectal:  Not performed.  Msk:  Symmetrical without gross deformities. Without edema, no deformity or joint  abnormality.  Neurologic:  Alert and  oriented x4;  grossly normal neurologically.  Skin:   Dry and intact without significant lesions or rashes. Psychiatric: Demonstrates good judgement and reason without abnormal affect or behaviors.  Most recent labs.  CMP     Component Value Date/Time   NA 140 06/19/2019 0848   NA 134 (A) 12/13/2013 0000   K 4.1 06/19/2019 0848   CL 105 06/19/2019 0848   CO2 31 06/19/2019 0848   GLUCOSE 94 06/19/2019 0848   BUN 14 06/19/2019 0848   BUN 11 12/13/2013 0000   CREATININE 0.92 06/19/2019 0848   CALCIUM 9.1 06/19/2019 0848   PROT 5.9 (L) 06/19/2019 0848   ALBUMIN 4.2 02/18/2015 1020   AST 23 06/19/2019 0848   ALT 19 06/19/2019 0848   ALKPHOS 64 02/18/2015 1020   BILITOT 0.6 06/19/2019 0848   GFRNONAA 65 06/19/2019 0848   GFRAA 76 06/19/2019 0848    Assessment: 1.  Positive Cologuard: In March 2.  IBS mixed: Flares of constipation/diarrhea and left lower quadrant pain which previously resolved with Donatel, colonoscopy in her 42s diagnosed this  Plan: 1.  Scheduled patient for colonoscopy in [place] with Dr. Tarri Glenn.  Patient requested a sooner appointment as she is very anxious about her results.  Discussed risks, benefits, limitations and alternatives and the patient agrees to proceed.  She has already been through her Covid vaccines and it has been more than 2 weeks since her last one. 2.  Stop Hyoscyamine.  Prescribe Dicyclomine 20 mg every 6 hours as needed for IBS symptoms.  #60 with 3 refills. 3.  Patient to follow in clinic per recommendations from Dr. Tarri Glenn after time of procedure.  Ellouise Newer, PA-C [facility] 07/11/2019, 10:43 AM  Cc: Donella Stade, PA-C

## 2019-07-11 NOTE — Patient Instructions (Signed)
If you are age 66 or older, your body mass index should be between 23-30. Your Body mass index is 22.32 kg/m. If this is out of the aforementioned range listed, please consider follow up with your Primary Care Provider.  If you are age 71 or younger, your body mass index should be between 19-25. Your Body mass index is 22.32 kg/m. If this is out of the aformentioned range listed, please consider follow up with your Primary Care Provider.   Stop Hyoscyamine Start Dicyclomine 20 mg every 6 hours as needed for abdominal pain.

## 2019-07-12 ENCOUNTER — Ambulatory Visit (AMBULATORY_SURGERY_CENTER): Payer: Medicare Other | Admitting: Gastroenterology

## 2019-07-12 ENCOUNTER — Encounter: Payer: Self-pay | Admitting: Gastroenterology

## 2019-07-12 ENCOUNTER — Other Ambulatory Visit: Payer: Self-pay

## 2019-07-12 VITALS — BP 135/70 | HR 44 | Temp 96.8°F | Resp 20 | Ht 63.0 in | Wt 127.0 lb

## 2019-07-12 DIAGNOSIS — D128 Benign neoplasm of rectum: Secondary | ICD-10-CM

## 2019-07-12 DIAGNOSIS — K648 Other hemorrhoids: Secondary | ICD-10-CM | POA: Diagnosis not present

## 2019-07-12 DIAGNOSIS — R195 Other fecal abnormalities: Secondary | ICD-10-CM

## 2019-07-12 DIAGNOSIS — K5730 Diverticulosis of large intestine without perforation or abscess without bleeding: Secondary | ICD-10-CM | POA: Diagnosis not present

## 2019-07-12 MED ORDER — SODIUM CHLORIDE 0.9 % IV SOLN: 500.0000 mL | Freq: Once | INTRAVENOUS | Status: DC

## 2019-07-12 NOTE — Progress Notes (Signed)
Called to room to assist during endoscopic procedure.  Patient ID and intended procedure confirmed with present staff. Received instructions for my participation in the procedure from the performing physician.  

## 2019-07-12 NOTE — Op Note (Signed)
[facility] Patient Name: Joy Golden Procedure Date: 07/12/2019 10:03 AM MRN: [id number] Endoscopist: Thornton Park MD, MD Age: 64 Referring MD:  Date of Birth: Dec 28, 1955 Gender: Female Account #: [id number] Procedure:                Colonoscopy Indications:              Positive Cologuard test                           No known family history of colon cancer or polyps                           Distant colonoscopy many years ago, ultimately                            diagnosed with IBS Medicines:                Monitored Anesthesia Care Procedure:                Pre-Anesthesia Assessment:                           - Prior to the procedure, a History and Physical                            was performed, and patient medications and                            allergies were reviewed. The patient's tolerance of                            previous anesthesia was also reviewed. The risks                            and benefits of the procedure and the sedation                            options and risks were discussed with the patient.                            All questions were answered, and informed consent                            was obtained. Prior Anticoagulants: The patient has                            taken no previous anticoagulant or antiplatelet                            agents. ASA Grade Assessment: II - A patient with                            mild systemic disease. After reviewing the risks  and benefits, the patient was deemed in                            satisfactory condition to undergo the procedure.                           After obtaining informed consent, the colonoscope                            was passed under direct vision. Throughout the                            procedure, the patient's blood pressure, pulse, and                            oxygen saturations were monitored continuously. The          Colonoscope was introduced through the anus and                            advanced to the 15 cm into the ileum. A second                            forward view of the right colon was performed. The                            colonoscopy was performed without difficulty. The                            patient tolerated the procedure well. The quality                            of the bowel preparation was good. The terminal                            ileum, ileocecal valve, appendiceal orifice, and                            rectum were photographed. Scope In: 10:07:08 AM Scope Out: 10:22:23 AM Scope Withdrawal Time: 0 hours 12 minutes 31 seconds  Total Procedure Duration: 0 hours 15 minutes 15 seconds  Findings:                 Hemorrhoids were found on perianal exam.                           Non-bleeding internal hemorrhoids were found. The                            hemorrhoids were small.                           Multiple small and large-mouthed diverticula were                            found  in the sigmoid colon and descending colon.                           A patchy area of mildly erythematous mucosa was                            found in the sigmoid colon in the area of                            diverticulosis. This may be related to prep                            artifact, but, biopsies were obtained to evaluate                            for inflammatory bowel disease and segmental                            colitis associated with diverticulosis. Biopsies                            were taken with a cold forceps for histology.                            Estimated blood loss was minimal.                           The colon (entire examined portion) appeared                            normal. Biopsies were taken from the left colon,                            transverse colon, and right colon. with a cold                            forceps for histology. Estimated  blood loss was                            minimal.                           A localized area of mucosa in the distal ileum was                            mildly erythematous. The more proximal terminal                            ileum appeared normal. Biopsies were taken with a                            cold forceps for histology. Estimated blood loss  was minimal.                           A 2 mm polyp was found in the rectum. The polyp was                            sessile. The polyp was removed with a cold snare.                            Resection and retrieval were complete. Estimated                            blood loss was minimal.                           The exam was otherwise without abnormality. Complications:            No immediate complications. Estimated blood loss:                            Minimal. Estimated Blood Loss:     Estimated blood loss was minimal. Impression:               - Hemorrhoids found on perianal exam.                           - Non-bleeding internal hemorrhoids.                           - Diverticulosis in the sigmoid colon and in the                            descending colon.                           - Erythematous mucosa in the sigmoid colon.                            Biopsied.                           - The entire examined colon is otherwise normal.                            Biopsied.                           - Erythematous mucosa in the distal ileum. Likely                            related to NSAIDs. Biopsied.                           - A small rectal polyp was removed.                           - The examination was otherwise normal on direct  and retroflexion views. Recommendation:           - Patient has a contact number available for                            emergencies. The signs and symptoms of potential                            delayed complications were discussed  with the                            patient. Return to normal activities tomorrow.                            Written discharge instructions were provided to the                            patient.                           - Resume previous diet.                           - Continue present medications.                           - Await pathology results.                           - No aspirin, ibuprofen, naproxen, or other                            non-steroidal anti-inflammatory drugs.                           - Repeat colonoscopy date to be determined after                            pending pathology results are reviewed for                            surveillance.                           - Follow a high fiber diet. Drink at least 64                            ounces of water daily. Add a daily stool bulking                            agent such as psyllium (an exampled would be                            Metamucil).                           - Emerging evidence supports eating a  diet of                            fruits, vegetables, grains, calcium, and yogurt                            while reducing red meat and alcohol may reduce the                            risk of colon cancer.                           - Thank you for allowing me to be involved in your                            colon cancer prevention.                           - Office appointment with Ellouise Newer or Dr.                            Tarri Glenn to review these results. Thornton Park MD, MD 07/12/2019 10:32:20 AM This report has been signed electronically.

## 2019-07-12 NOTE — Progress Notes (Signed)
Report given to PACU, vss 

## 2019-07-12 NOTE — Patient Instructions (Signed)
Handouts given for hemorrhoids, diverticulosis, high fiber diet and polyps.  YOU HAD AN ENDOSCOPIC PROCEDURE TODAY AT [facility]:   Refer to the procedure report that was given to you for any specific questions about what was found during the examination.  If the procedure report does not answer your questions, please call your gastroenterologist to clarify.  If you requested that your care partner not be given the details of your procedure findings, then the procedure report has been included in a sealed envelope for you to review at your convenience later.  YOU SHOULD EXPECT: Some feelings of bloating in the abdomen. Passage of more gas than usual.  Walking can help get rid of the air that was put into your GI tract during the procedure and reduce the bloating. If you had a lower endoscopy (such as a colonoscopy or flexible sigmoidoscopy) you may notice spotting of blood in your stool or on the toilet paper. If you underwent a bowel prep for your procedure, you may not have a normal bowel movement for a few days.  Please Note:  You might notice some irritation and congestion in your nose or some drainage.  This is from the oxygen used during your procedure.  There is no need for concern and it should clear up in a day or so.  SYMPTOMS TO REPORT IMMEDIATELY:   Following lower endoscopy (colonoscopy or flexible sigmoidoscopy):  Excessive amounts of blood in the stool  Significant tenderness or worsening of abdominal pains  Swelling of the abdomen that is new, acute  Fever of 100F or higher  For urgent or emergent issues, a gastroenterologist can be reached at any hour by calling [phone]. Do not use MyChart messaging for urgent concerns.    DIET:  We do recommend a small meal at first, but then you may proceed to your regular diet.  Drink plenty of fluids but you should avoid alcoholic beverages for 24 hours.  ACTIVITY:  You should plan to take it easy for the rest of  today and you should NOT DRIVE or use heavy machinery until tomorrow (because of the sedation medicines used during the test).    FOLLOW UP: Our staff will call the number listed on your records 48-72 hours following your procedure to check on you and address any questions or concerns that you may have regarding the information given to you following your procedure. If we do not reach you, we will leave a message.  We will attempt to reach you two times.  During this call, we will ask if you have developed any symptoms of COVID 19. If you develop any symptoms (ie: fever, flu-like symptoms, shortness of breath, cough etc.) before then, please call [phone].  If you test positive for Covid 19 in the 2 weeks post procedure, please call and report this information to [place].    If any biopsies were taken you will be contacted by phone or by letter within the next 1-3 weeks.  Please call us at [phone] if you have not heard about the biopsies in 3 weeks.    SIGNATURES/CONFIDENTIALITY: You and/or your care partner have signed paperwork which will be entered into your electronic medical record.  These signatures attest to the fact that that the information above on your After Visit Summary has been reviewed and is understood.  Full responsibility of the confidentiality of this discharge information lies with you and/or your care-partner.

## 2019-07-12 NOTE — Progress Notes (Signed)
Pt has bloated, distended abdomen upon arrival to [facility].  Pt wakes, unable to pass gas, warm compress to abdomen, pt states she always has bloating and distension and this is nothing new.  After 10 mins, levsin given, pt still c/o pain and repositioning measures have not allowed her to pass gas.  simethicon given, pt walks to bathroom with assist, pt begs nurse not to leave her, let her know I was staying with her, pt sits on toilet, begins belching, c/o pain at shoulder blades and abdomen remains bloated and distended, pt state it is not any worse than her usual bloating and distension.  Dr Tarri Glenn notified, pt gets dressed with assist and able to go home with daughter s complaint.

## 2019-07-14 ENCOUNTER — Telehealth: Payer: Self-pay

## 2019-07-14 NOTE — Telephone Encounter (Signed)
  Follow up Call-  Call back number 07/12/2019  Post procedure Call Back phone  # [phone]  Permission to leave phone message Yes  Some recent data might be hidden     Patient questions:  Do you have a fever, pain , or abdominal swelling? No. Pain Score  0 *  Have you tolerated food without any problems? Yes.    Have you been able to return to your normal activities? Yes.    Do you have any questions about your discharge instructions: Diet   No. Medications  No. Follow up visit  No.  Do you have questions or concerns about your Care? No.  Actions: * If pain score is 4 or above: No action needed, pain <4.  1. Have you developed a fever since your procedure? No  2.   Have you had an respiratory symptoms (SOB or cough) since your procedure? No  3.   Have you tested positive for COVID 19 since your procedure No  4.   Have you had any family members/close contacts diagnosed with the COVID 19 since your procedure?  No   If yes to any of these questions please route to Joylene John, RN and Erenest Rasher, RN

## 2019-07-17 ENCOUNTER — Encounter: Payer: Self-pay | Admitting: Gastroenterology

## 2019-08-03 ENCOUNTER — Telehealth: Payer: Self-pay | Admitting: Gastroenterology

## 2019-08-04 NOTE — Telephone Encounter (Signed)
Patient has no GI concerns.  She had her procedure due to a positive Cologuard.  Appt cancelled for follow up .  She understands to call for any questions or concerns

## 2019-08-22 ENCOUNTER — Ambulatory Visit: Payer: Medicare Other | Admitting: Gastroenterology

## 2019-10-19 ENCOUNTER — Telehealth: Payer: Self-pay | Admitting: Neurology

## 2019-10-19 DIAGNOSIS — E063 Autoimmune thyroiditis: Secondary | ICD-10-CM

## 2019-10-19 MED ORDER — LEVOTHYROXINE SODIUM 88 MCG PO TABS: 88.0000 ug | ORAL_TABLET | Freq: Every day | ORAL | 0 refills | Status: DC

## 2019-10-20 ENCOUNTER — Other Ambulatory Visit: Payer: Self-pay

## 2019-10-20 DIAGNOSIS — E063 Autoimmune thyroiditis: Secondary | ICD-10-CM

## 2019-10-20 MED ORDER — LEVOTHYROXINE SODIUM 88 MCG PO TABS: 88.0000 ug | ORAL_TABLET | Freq: Every day | ORAL | 0 refills | Status: DC

## 2019-10-20 MED ORDER — LEVOXYL 100 MCG PO TABS: 100.0000 ug | ORAL_TABLET | Freq: Every day | ORAL | 0 refills | Status: DC

## 2019-10-20 NOTE — Telephone Encounter (Signed)
Thank you :)

## 2019-10-20 NOTE — Telephone Encounter (Signed)
Its fine to send the 90 my question was one is BRAND only. Is she needing the 79mcg to be BRAND only or is she ok with generic. I just wanted to make sure it was sent the right way.

## 2019-10-20 NOTE — Telephone Encounter (Signed)
Per pt - she is requesting for 88 mcg as a 90 day supply and the 100 mcg as a 30 day supply. Pt does not want to make any changes. Pt was informed that thyroid levels are due for a check. She is currently visiting [place]. Pt is going to locate a [facility] lab/fax number so that the order can be sent. Pt has updated preferred pharmacy on file. Is it appropriate to send both rxs as pt is requesting? Pls advise, thanks.

## 2019-10-20 NOTE — Telephone Encounter (Signed)
That is fine. I know she alternates 176mcg and 47mcg the 110mcg is brand only does she want this brand only?

## 2019-10-20 NOTE — Telephone Encounter (Signed)
Task completed. Pt has been updated that med refills sent to preferred pharmacy in [place]. Lab ordered for thyroid check.

## 2019-10-20 NOTE — Telephone Encounter (Signed)
My apologies! Pt was ok with the 88 mcg being generic and 100 mcg being brand.

## 2019-10-20 NOTE — Addendum Note (Signed)
Addended by: Mertha Finders on: 10/20/2019 02:02 PM   Modules accepted: Orders

## 2019-10-20 NOTE — Telephone Encounter (Signed)
Voicemail received from patient asking for her 77mcg of levothyroxine be changed to a 90 day supply vs a 30 day supply.  Patient states that she [place] in the [place] area [place] and is unable to make multiples trips to the pharmacy for monthly doses of medication. Will check with provider to see if this is a possibility for this patient. Laiken Sandy,CMA

## 2019-10-26 ENCOUNTER — Encounter: Payer: Self-pay | Admitting: Physician Assistant

## 2019-11-09 LAB — TSH: TSH: 9.95 — AB (ref 0.41–5.90)

## 2019-11-19 ENCOUNTER — Encounter: Payer: Self-pay | Admitting: Physician Assistant

## 2019-11-22 NOTE — Telephone Encounter (Signed)
Not in chart yet, placed in your paper basket

## 2020-02-02 ENCOUNTER — Other Ambulatory Visit: Payer: Self-pay | Admitting: Physician Assistant

## 2020-03-08 ENCOUNTER — Encounter: Payer: Self-pay | Admitting: Physician Assistant

## 2020-03-08 DIAGNOSIS — E063 Autoimmune thyroiditis: Secondary | ICD-10-CM

## 2020-03-08 MED ORDER — LEVOTHYROXINE SODIUM 88 MCG PO TABS: 88.0000 ug | ORAL_TABLET | Freq: Every day | ORAL | 0 refills | Status: DC

## 2020-03-09 LAB — TSH: TSH: 2.67 mIU/L (ref 0.40–4.50)

## 2020-03-11 ENCOUNTER — Other Ambulatory Visit: Payer: Self-pay

## 2020-03-11 DIAGNOSIS — E063 Autoimmune thyroiditis: Secondary | ICD-10-CM

## 2020-03-11 MED ORDER — LEVOTHYROXINE SODIUM 88 MCG PO TABS: 88.0000 ug | ORAL_TABLET | Freq: Every day | ORAL | 1 refills | Status: DC

## 2020-03-11 NOTE — Progress Notes (Signed)
Thyroid testing ordered for pt to complete in 6 mths.

## 2020-03-11 NOTE — Telephone Encounter (Signed)
Joy Golden,   Perfect TSH level. Stay at same dose recheck in 6 months. Where do you want refills sent?

## 2020-03-17 ENCOUNTER — Encounter: Payer: Self-pay | Admitting: Physician Assistant

## 2020-03-18 ENCOUNTER — Telehealth (INDEPENDENT_AMBULATORY_CARE_PROVIDER_SITE_OTHER): Payer: Medicare Other | Admitting: Physician Assistant

## 2020-03-18 ENCOUNTER — Encounter: Payer: Self-pay | Admitting: *Deleted

## 2020-03-18 ENCOUNTER — Encounter: Payer: Self-pay | Admitting: Physician Assistant

## 2020-03-18 VITALS — Ht 63.0 in | Wt 127.0 lb

## 2020-03-18 DIAGNOSIS — K5790 Diverticulosis of intestine, part unspecified, without perforation or abscess without bleeding: Secondary | ICD-10-CM | POA: Diagnosis not present

## 2020-03-18 DIAGNOSIS — R140 Abdominal distension (gaseous): Secondary | ICD-10-CM | POA: Diagnosis not present

## 2020-03-18 DIAGNOSIS — R1032 Left lower quadrant pain: Secondary | ICD-10-CM

## 2020-03-18 MED ORDER — METRONIDAZOLE 500 MG PO TABS: 500.0000 mg | ORAL_TABLET | Freq: Three times a day (TID) | ORAL | 0 refills | Status: DC

## 2020-03-18 MED ORDER — CIPROFLOXACIN HCL 500 MG PO TABS: 500.0000 mg | ORAL_TABLET | Freq: Two times a day (BID) | ORAL | 0 refills | Status: DC

## 2020-03-18 NOTE — Progress Notes (Signed)
Patient ID: Joy Golden, female   DOB: 08-07-1955, 64 y.o.   MRN: [id number] .Marland KitchenVirtual Visit via Telephone Note  I connected with Joy Golden on 03/18/20 at  2:40 PM EST by telephone and verified that I am speaking with the correct person using two identifiers.  Location: Patient: home Provider: clinic  .Marland KitchenParticipating in visit:  Patient: Joy Golden Provider: Iran Planas PA-C Provider in training: Ola Spurr PA-Student    I discussed the limitations, risks, security and privacy concerns of performing an evaluation and management service by telephone and the availability of in person appointments. I also discussed with the patient that there may be a patient responsible charge related to this service. The patient expressed understanding and agreed to proceed.   History of Present Illness: Patient is a 64 year old female with history of diverticulosis and IBS-C who presents to the clinic via telephone to discuss 6 days of worsening abdominal pain in the left lower quadrant, abdominal distention and constipation. She is still having some stools just very little and hard. Appetite decreased. Pain "brings her to her knees at times". No fever, chills, body aches. She is having some night sweats. No nausea or vomiting. No melena or hematochezia.    Marland Kitchen. Active Ambulatory Problems    Diagnosis Date Noted   Thyroid activity decreased 04/11/2014   Lumbar radiculopathy 04/11/2014   Left-sided low back pain with left-sided sciatica 04/11/2014   Lumbar herniated disc 04/11/2014   Seborrheic keratosis 07/18/2014   Hyperlipidemia 02/19/2015   Itching 03/02/2016   Metatarsalgia of right foot 05/04/2016   Right foot pain 08/19/2016   [place] 03/11/2017   Irritable bowel syndrome with constipation 03/11/2017   Hashimoto's thyroiditis 04/05/2017   Stone of salivary gland or duct 07/08/2017   Menopause 08/16/2018   Family history of Alzheimer's disease 08/17/2018   Positive  colorectal cancer screening using Cologuard test 06/30/2019   Resolved Ambulatory Problems    Diagnosis Date Noted   Elevated LDL cholesterol level 03/08/2017   Past Medical History:  Diagnosis Date   Allergy    Anxiety    Arthritis    Heart murmur    HLD (hyperlipidemia)    Hypothyroidism    Spastic colon    Reviewed med, allergy, problem list.    Observations/Objective: No acute distress.  Normal breathing.   .. Today's Vitals   03/18/20 1414  Weight: 127 lb (57.6 kg)  Height: 5\' 3"  (1.6 m)   Body mass index is 22.5 kg/m.    Assessment and Plan: Marland KitchenMarland KitchenRaquelle was seen today for diverticulitis.  Diagnoses and all orders for this visit:  Diverticulosis -     CBC with Differential/Platelet -     COMPLETE METABOLIC PANEL WITH GFR -     Lipase -     ciprofloxacin (CIPRO) 500 MG tablet; Take 1 tablet (500 mg total) by mouth 2 (two) times daily. For 10 days. -     metroNIDAZOLE (FLAGYL) 500 MG tablet; Take 1 tablet (500 mg total) by mouth 3 (three) times daily. For 10 days.  Abdominal distension (gaseous) -     CT Abdomen Pelvis W Contrast -     CBC with Differential/Platelet -     COMPLETE METABOLIC PANEL WITH GFR -     Lipase -     ciprofloxacin (CIPRO) 500 MG tablet; Take 1 tablet (500 mg total) by mouth 2 (two) times daily. For 10 days. -     metroNIDAZOLE (FLAGYL) 500 MG tablet; Take 1 tablet (500 mg total)  by mouth 3 (three) times daily. For 10 days.  Left lower quadrant abdominal pain -     CT Abdomen Pelvis W Contrast -     CBC with Differential/Platelet -     COMPLETE METABOLIC PANEL WITH GFR -     Lipase -     ciprofloxacin (CIPRO) 500 MG tablet; Take 1 tablet (500 mg total) by mouth 2 (two) times daily. For 10 days. -     metroNIDAZOLE (FLAGYL) 500 MG tablet; Take 1 tablet (500 mg total) by mouth 3 (three) times daily. For 10 days.  strongly suspect diverticulitis. Need CT since this would be the first flare. CT ordered with labs.   Cipro/metronidazole sent over for empirical treatment. Discussed hydration. Tylenol for pain.     Follow Up Instructions:    I discussed the assessment and treatment plan with the patient. The patient was provided an opportunity to ask questions and all were answered. The patient agreed with the plan and demonstrated an understanding of the instructions.   The patient was advised to call back or seek an in-person evaluation if the symptoms worsen or if the condition fails to improve as anticipated.  I provided 20 minutes of non-face-to-face time during this encounter.   Iran Planas, PA-C

## 2020-03-18 NOTE — Progress Notes (Deleted)
Started 6 days ago Constipation/diarrhea  Bloating Distention Pain in back (low) LLQ pain Night sweats  No nausea/vomiting Has been eating soft/liquid foods

## 2020-03-18 NOTE — Telephone Encounter (Signed)
Pt seen in office today.

## 2020-03-19 ENCOUNTER — Other Ambulatory Visit: Payer: Self-pay

## 2020-03-19 ENCOUNTER — Encounter: Payer: Self-pay | Admitting: Physician Assistant

## 2020-03-19 ENCOUNTER — Ambulatory Visit (HOSPITAL_BASED_OUTPATIENT_CLINIC_OR_DEPARTMENT_OTHER)
Admission: RE | Admit: 2020-03-19 | Discharge: 2020-03-19 | Disposition: A | Discharge status: Home / Self Care | Payer: Medicare Other | Source: Ambulatory Visit | Attending: Physician Assistant | Admitting: Physician Assistant

## 2020-03-19 DIAGNOSIS — R1032 Left lower quadrant pain: Secondary | ICD-10-CM | POA: Diagnosis present

## 2020-03-19 DIAGNOSIS — I700 Atherosclerosis of aorta: Secondary | ICD-10-CM | POA: Insufficient documentation

## 2020-03-19 DIAGNOSIS — R140 Abdominal distension (gaseous): Secondary | ICD-10-CM | POA: Diagnosis present

## 2020-03-19 DIAGNOSIS — K5792 Diverticulitis of intestine, part unspecified, without perforation or abscess without bleeding: Secondary | ICD-10-CM | POA: Insufficient documentation

## 2020-03-19 LAB — CBC WITH DIFFERENTIAL/PLATELET
Absolute Monocytes: 479 cells/uL (ref 200–950)
Basophils Absolute: 53 cells/uL (ref 0–200)
Basophils Relative: 0.7 %
Eosinophils Absolute: 99 cells/uL (ref 15–500)
Eosinophils Relative: 1.3 %
HCT: 42.4 % (ref 35.0–45.0)
Hemoglobin: 14.4 g/dL (ref 11.7–15.5)
Lymphs Abs: 2006 cells/uL (ref 850–3900)
MCH: 31.8 pg (ref 27.0–33.0)
MCHC: 34 g/dL (ref 32.0–36.0)
MCV: 93.6 fL (ref 80.0–100.0)
MPV: 10.7 fL (ref 7.5–12.5)
Monocytes Relative: 6.3 %
Neutro Abs: 4963 cells/uL (ref 1500–7800)
Neutrophils Relative %: 65.3 %
Platelets: 396 10*3/uL (ref 140–400)
RBC: 4.53 10*6/uL (ref 3.80–5.10)
RDW: 11.8 % (ref 11.0–15.0)
Total Lymphocyte: 26.4 %
WBC: 7.6 10*3/uL (ref 3.8–10.8)

## 2020-03-19 LAB — COMPLETE METABOLIC PANEL WITH GFR
AG Ratio: 1.4 (calc) (ref 1.0–2.5)
ALT: 20 U/L (ref 6–29)
AST: 24 U/L (ref 10–35)
Albumin: 4.3 g/dL (ref 3.6–5.1)
Alkaline phosphatase (APISO): 76 U/L (ref 37–153)
BUN/Creatinine Ratio: 9 (calc) (ref 6–22)
BUN: 14 mg/dL (ref 7–25)
CO2: 30 mmol/L (ref 20–32)
Calcium: 10.1 mg/dL (ref 8.6–10.4)
Chloride: 97 mmol/L — ABNORMAL LOW (ref 98–110)
Creat: 1.54 mg/dL — ABNORMAL HIGH (ref 0.50–0.99)
GFR, Est African American: 40 mL/min/{1.73_m2} — ABNORMAL LOW (ref >=60)
GFR, Est Non African American: 35 mL/min/{1.73_m2} — ABNORMAL LOW (ref >=60)
Globulin: 3 g/dL (calc) (ref 1.9–3.7)
Glucose, Bld: 99 mg/dL (ref 65–99)
Potassium: 4.2 mmol/L (ref 3.5–5.3)
Sodium: 139 mmol/L (ref 135–146)
Total Bilirubin: 0.5 mg/dL (ref 0.2–1.2)
Total Protein: 7.3 g/dL (ref 6.1–8.1)

## 2020-03-19 LAB — LIPASE: Lipase: 25 U/L (ref 7–60)

## 2020-03-19 MED ORDER — IOHEXOL 300 MG/ML  SOLN
100.0000 mL | Freq: Once | INTRAMUSCULAR | Status: AC | PRN
  Administered 2020-03-19: 100 mL via INTRAVENOUS

## 2020-03-19 NOTE — Progress Notes (Signed)
CT confirms diverticulitis. Cipro and metronidazole should treat. You do have a lot of plaque accumulation in abdominal aorta and surrounding vessels. This puts you higher risk for CV event. I would strongly recommend you to consider a statin to lower your plaque and reduce CV risk.

## 2020-03-19 NOTE — Progress Notes (Signed)
Ha,   Your pancreatic enzymes are normal.  CBC still pending.  Your kidney function has decreased significantly. Likely once the cause of pain and problem identified and treated this will go back up. Make sure you are pushing fluids. It does make me wonder a little more about kidney stones. The CT will show that as well.

## 2020-03-19 NOTE — Progress Notes (Signed)
No sign of infection by CBC.

## 2020-03-20 ENCOUNTER — Other Ambulatory Visit: Payer: Self-pay | Admitting: Physician Assistant

## 2020-03-20 DIAGNOSIS — N179 Acute kidney failure, unspecified: Secondary | ICD-10-CM

## 2020-03-20 MED ORDER — ROSUVASTATIN CALCIUM 5 MG PO TABS: 5.0000 mg | ORAL_TABLET | Freq: Every day | ORAL | 3 refills | Status: DC

## 2020-03-20 MED ORDER — ONDANSETRON 8 MG PO TBDP: 8.0000 mg | ORAL_TABLET | Freq: Three times a day (TID) | ORAL | 3 refills | Status: DC | PRN

## 2020-03-20 NOTE — Progress Notes (Signed)
Repeat bmp on AKI

## 2020-03-20 NOTE — Telephone Encounter (Signed)
Patient had also left a vm that she wanted to discuss results with Reata Petrov. I called her back, not realizing she had wrote a Pharmacist, community message. She states pain/nausea no better yet with antibiotics. Urine is dark. She is very concerned about kidney function.

## 2020-03-25 LAB — BASIC METABOLIC PANEL WITH GFR
BUN: 11 mg/dL (ref 7–25)
CO2: 32 mmol/L (ref 20–32)
Calcium: 9.8 mg/dL (ref 8.6–10.4)
Chloride: 101 mmol/L (ref 98–110)
Creat: 0.88 mg/dL (ref 0.50–0.99)
GFR, Est African American: 79 mL/min/{1.73_m2} (ref >=60)
GFR, Est Non African American: 68 mL/min/{1.73_m2} (ref >=60)
Glucose, Bld: 72 mg/dL (ref 65–99)
Potassium: 4.4 mmol/L (ref 3.5–5.3)
Sodium: 139 mmol/L (ref 135–146)

## 2020-03-26 NOTE — Progress Notes (Signed)
GREAT news. Kidney function went back to normal. It was elevated due to acute stress from the diverticulitis.

## 2020-03-28 ENCOUNTER — Encounter: Payer: Self-pay | Admitting: Physician Assistant

## 2020-03-28 DIAGNOSIS — K5792 Diverticulitis of intestine, part unspecified, without perforation or abscess without bleeding: Secondary | ICD-10-CM

## 2020-03-28 DIAGNOSIS — K582 Mixed irritable bowel syndrome: Secondary | ICD-10-CM

## 2020-04-02 MED ORDER — ALPRAZOLAM 0.5 MG PO TABS: 0.5000 mg | ORAL_TABLET | Freq: Two times a day (BID) | ORAL | 1 refills | Status: DC | PRN

## 2020-04-16 ENCOUNTER — Telehealth: Payer: Self-pay | Admitting: Gastroenterology

## 2020-04-16 NOTE — Telephone Encounter (Signed)
Inbound call from patient requesting a call back from the nurse.  PCP sent a referral for patient for abdominal pain after diverticulitis flare which she states has subsided now.  Wants to know if she should still schedule an appointment.  Please advise.

## 2020-04-16 NOTE — Telephone Encounter (Signed)
Spoke with pt and she had diverticulitis in December and is completely better now. Stated that she had a hard time with the antibiotics and her GI tract. Discussed with her sometimes a different antibiotic may be prescribed if one causes GI upset. Pt verbalized understanding and states she will call back if she had further issues. Pt did not want to schedule an appt at this time.

## 2020-05-03 ENCOUNTER — Encounter: Payer: Self-pay | Admitting: Physician Assistant

## 2020-05-03 ENCOUNTER — Telehealth: Payer: Self-pay

## 2020-05-03 DIAGNOSIS — E063 Autoimmune thyroiditis: Secondary | ICD-10-CM

## 2020-05-03 MED ORDER — LEVOTHYROXINE SODIUM 100 MCG PO TABS: 100.0000 ug | ORAL_TABLET | ORAL | 3 refills | Status: DC

## 2020-05-03 MED ORDER — LEVOXYL 100 MCG PO TABS: 100.0000 ug | ORAL_TABLET | Freq: Every day | ORAL | 3 refills | Status: DC

## 2020-05-03 NOTE — Telephone Encounter (Signed)
Called patient. She needs levothyroxine 100 mcg. Not Levoxyl. Medication sent.

## 2020-05-03 NOTE — Telephone Encounter (Signed)
Patient states she is taking the levothyroxine 100 mcg once or twice weekly. I did send in a prescription with those directions.

## 2020-05-15 ENCOUNTER — Other Ambulatory Visit: Payer: Medicare Other

## 2020-05-15 ENCOUNTER — Other Ambulatory Visit (INDEPENDENT_AMBULATORY_CARE_PROVIDER_SITE_OTHER): Payer: Medicare Other

## 2020-05-15 ENCOUNTER — Ambulatory Visit (INDEPENDENT_AMBULATORY_CARE_PROVIDER_SITE_OTHER): Payer: Medicare Other | Admitting: Gastroenterology

## 2020-05-15 ENCOUNTER — Encounter: Payer: Self-pay | Admitting: Gastroenterology

## 2020-05-15 VITALS — BP 104/64 | HR 49 | Ht 63.0 in | Wt 128.0 lb

## 2020-05-15 DIAGNOSIS — R1084 Generalized abdominal pain: Secondary | ICD-10-CM

## 2020-05-15 DIAGNOSIS — K5732 Diverticulitis of large intestine without perforation or abscess without bleeding: Secondary | ICD-10-CM

## 2020-05-15 LAB — CBC WITH DIFFERENTIAL/PLATELET
Basophils Absolute: 0 10*3/uL (ref 0.0–0.1)
Basophils Relative: 0.7 % (ref 0.0–3.0)
Eosinophils Absolute: 0.2 10*3/uL (ref 0.0–0.7)
Eosinophils Relative: 2.7 % (ref 0.0–5.0)
HCT: 41.3 % (ref 36.0–46.0)
Hemoglobin: 13.9 g/dL (ref 12.0–15.0)
Lymphocytes Relative: 35.9 % (ref 12.0–46.0)
Lymphs Abs: 2.6 10*3/uL (ref 0.7–4.0)
MCHC: 33.6 g/dL (ref 30.0–36.0)
MCV: 94.6 fl (ref 78.0–100.0)
Monocytes Absolute: 0.5 10*3/uL (ref 0.1–1.0)
Monocytes Relative: 6.2 % (ref 3.0–12.0)
Neutro Abs: 4 10*3/uL (ref 1.4–7.7)
Neutrophils Relative %: 54.5 % (ref 43.0–77.0)
Platelets: 332 10*3/uL (ref 150.0–400.0)
RBC: 4.36 Mil/uL (ref 3.87–5.11)
RDW: 13.6 % (ref 11.5–15.5)
WBC: 7.3 10*3/uL (ref 4.0–10.5)

## 2020-05-15 LAB — CREATININE, SERUM: Creatinine, Ser: 0.93 mg/dL (ref 0.40–1.20)

## 2020-05-15 LAB — BUN: BUN: 17 mg/dL (ref 6–23)

## 2020-05-15 MED ORDER — AMOXICILLIN-POT CLAVULANATE 875-125 MG PO TABS: 1.0000 | ORAL_TABLET | Freq: Three times a day (TID) | ORAL | 0 refills | Status: AC

## 2020-05-15 NOTE — Patient Instructions (Addendum)
RECOMMENDATION(S):   LABS:   . Your provider has requested that you go to the basement level for lab work before leaving today. Press "B" on the elevator. The lab is located at the first door on the left as you exit the elevator.  HEALTHCARE LAWS AND MY CHART RESULTS:   . Due to recent changes in healthcare laws, you may see the results of your imaging and laboratory studies on MyChart before your provider has had a chance to review them.  We understand that in some cases there may be results that are confusing or concerning to you. Not all laboratory results come back in the same time frame and the provider may be waiting for multiple results in order to interpret others.  Please give [place] 48 hours in order for your provider to thoroughly review all the results before contacting the office for clarification of your results.   PRESCRIPTION MEDICATION(S):   We have sent the following medication(s) to your pharmacy:  . Augmentin - Please take by mouth every 8 hours for 10 days, then stop  CT SCAN:  . You have been scheduled for an URGENT CT of the abdomen and pelvis at [facility] (1st floor of the hospital) on 05/16/20 at [facility]. Please arrive @ 2:45PM for registration.   PREP:   . Do not eat anything after 11AM (4 hours prior to your test)  . Drink 1 bottle of contrast @ 1PM (2 hours prior to your exam)  . Drink 1 bottle of contrast @ 2PM (1 hour prior to your exam)   CONTRAST:  . You will need to stop by [facility] TODAY to pick up 2 bottles of contrast. . The solution may taste better if refrigerated. . DO NOT add ice or dilute the contrast.  . Shake well before drinking.   MEDICATIONS:  . You may take your medications as prescribed with a small amount of water, if necessary.  . The following medications MAY need to be held 48 hours before your exam: METFORMIN, GLUCOPHAGE, [place], AVANDAMET, RIOMET, FORTAMET, [place] MET, JANUMET, GLUMETZA or METAGLIP. Please  contact [facility] at [phone] between the hours of 8:00 am and 5:00 pm, Monday-Friday to inquire further.   NEED TO RESCHEDULE?   Marland Kitchen Please call radiology at [phone].  WHY ARE YOU HAVING THIS EXAM?  . The purpose of you drinking the oral contrast is to aid in the visualization of your intestinal tract. The contrast solution may cause some diarrhea. Depending on your individual set of symptoms, you may also receive an intravenous injection of x-ray contrast/dye. Plan on being at [facility] for 45 minutes or longer, depending on the type of exam you are having performed.   BMI:  If you are age 72 or older, your body mass index should be between 23-30. Your Body mass index is 22.67 kg/m. If this is out of the aforementioned range listed, please consider follow up with your Primary Care Provider.  Thank you for trusting me with your gastrointestinal care!    Thornton Park, MD, MPH

## 2020-05-15 NOTE — Progress Notes (Signed)
Referring Provider: Lavada Mesi Primary Care Physician:  Donella Stade, PA-C  Chief complaint:  Abdominal pain  IMPRESSION:  CT-documented sigmoid diverticulitis with recurrent symptoms soon after completed 10 days of Cipro and Flagyl is worrisome for development of complication such as abscess.   PLAN: - CBC with diff - CT abd/pelvis - Discussed various antibiotic options and potential side effects. Agreed on Augmentin 875 mg every 8 hours x 10 days - Follow-up in 2 weeks, earlier if needed    Please see the "Patient Instructions" section for addition details about the plan.  HPI: Joy Golden is a 65 y.o. female returns with concerns for recurrent diverticulitis. She has a history of "spastic colon." Stress-related IBS symptoms of alternating diarrhea and constipation previously controlled on Donnatal, incompletely treated with hyoscayamine. Originally seen in 06/2019 for a positive cologuard. Had a colonoscopy 4/21 showed diverticulosis, hemorrhoids, and a rectal tubular adenoma. Erythema present in the area of the diverticulosis was normal on histopathology. Surveillance colonoscopy recommended in 7 years. Stopped using all NSAIDs at that time.   Developed symptoms of acute diverticulitis in December.   Normal CBC 03/18/20 CT abd/pelvis with contrast 47/09/62 showed uncomplicated sigmoid diverticulitis.  Completed 10 days of antibiotics with near complete resolution of her symptoms Although she did not tolerate the antibiotics due to side effects Felt better until 10 days ago when she felt recurrent abdominal pain and cramping, internal soreness, and postprandial borborygmous. Nocturnal symptoms that are awaking her from sleep. Is now also having loose stools. No fevers, chills, or night sweats.     Past Medical History:  Diagnosis Date  . Allergy   . Anxiety   . Arthritis    Osteoarthritis  . Heart murmur    some say yes, some say no  . HLD (hyperlipidemia)     pt denies 07/12/19  . Hypothyroidism   . Spastic colon     Past Surgical History:  Procedure Laterality Date  . ABDOMINAL HYSTERECTOMY  1983  . AUGMENTATION MAMMAPLASTY    . BACK SURGERY  2002   L4-L5 laminectomy  . BREAST ENHANCEMENT SURGERY  2005  . CARPAL TUNNEL RELEASE Right 2010  . CARPAL TUNNEL WITH CUBITAL TUNNEL Left   . DE QUERVAIN'S RELEASE Right   . ELBOW SURGERY Left 2013  . GANGLION CYST EXCISION Right 2012  . HARDWARE REMOVAL Right    foot  . HERNIA REPAIR  1970/1980   4 surgeries- abdominal  . PARTIAL HYSTERECTOMY  1976  . REMOVAL OF IMPLANT Right 08/21/2016   RT FOOT  . REPLACEMENT TOTAL KNEE Right 2009  . SACROILIAC JOINT FUSION Left 06/19/2014   Procedure: SACROILIAC JOINT FUSION;  Surgeon: Phylliss Bob, MD;  Location: [place];  Service: Orthopedics;  Laterality: Left;  Left sided sacroiliac joint fusion  . TOE SURGERY Right    great toe horse stepped on it  . TONSILLECTOMY  1967  . TRIGGER FINGER RELEASE Right    thumb    Current Outpatient Medications  Medication Sig Dispense Refill  . albuterol (PROVENTIL HFA;VENTOLIN HFA) 108 (90 Base) MCG/ACT inhaler Inhale 2 puffs into the lungs every 6 (six) hours as needed for wheezing or shortness of breath. 1 Inhaler 1  . ALPRAZolam (XANAX) 0.5 MG tablet Take 1 tablet (0.5 mg total) by mouth 2 (two) times daily as needed for anxiety. 30 tablet 1  . AMBULATORY NON FORMULARY MEDICATION Bacoba Monieri 1000 mg daily    . amoxicillin-clavulanate (AUGMENTIN) 875-125 MG tablet Take  1 tablet by mouth every 8 (eight) hours for 10 days. 30 tablet 0  . b complex vitamins tablet Take 1 tablet by mouth daily. Bio B    . CITICOLINE PO Take 1 capsule by mouth daily.    Marland Kitchen dicyclomine (BENTYL) 10 MG capsule Take 2 capsules (20 mg total) by mouth every 6 (six) hours as needed for spasms. 60 capsule 3  . estradiol (ESTRACE) 0.5 MG tablet Take 1 tablet (0.5 mg total) by mouth daily. 90 tablet 3  . hydrOXYzine (ATARAX/VISTARIL) 10  MG tablet Take 1 tablet (10 mg total) by mouth 3 (three) times daily as needed. 90 tablet 1  . levothyroxine (SYNTHROID) 100 MCG tablet Take 1 tablet (100 mcg total) by mouth 2 (two) times a week. 24 tablet 3  . levothyroxine (SYNTHROID) 88 MCG tablet Take 1 tablet (88 mcg total) by mouth daily. Needs labs 90 tablet 1  . Multiple Vitamin (MULTIVITAMIN) tablet Take 1 tablet by mouth daily.    . naproxen (NAPROSYN) 500 MG tablet TAKE 1 TABLET BY MOUTH THREE TIMES DAILY WITH MEALS 270 tablet 0  . Omega-3 Fatty Acids (OMEGA-3 CF PO) Take by mouth.    . Probiotic Product (PROBIOTIC PO) Take by mouth. Strengtia    . rosuvastatin (CRESTOR) 5 MG tablet Take 1 tablet (5 mg total) by mouth daily. 90 tablet 3  . triamcinolone cream (KENALOG) 0.1 % Apply 1 application topically 2 (two) times daily. 60 g 0   No current facility-administered medications for this visit.    Allergies as of 05/15/2020 - Review Complete 05/15/2020  Allergen Reaction Noted  . Mobic [meloxicam]  04/23/2014      Physical Exam: General:   Alert,  well-nourished, pleasant and cooperative in NAD Head:  Normocephalic and atraumatic. Eyes:  Sclera clear, no icterus.   Conjunctiva pink. Ears:  Normal auditory acuity. Nose:  No deformity, discharge,  or lesions. Mouth:  No deformity or lesions.   Neck:  Supple; no masses or thyromegaly. Lungs:  Clear throughout to auscultation.   No wheezes. Heart:  Regular rate and rhythm; no murmurs. Abdomen:  Soft,mild tenderness in the LLQ without rebound there is some mild guarding nondistended, normal bowel sounds. No hepatosplenomegaly.   Rectal:  Deferred  Msk:  Symmetrical. No boney deformities LAD: No inguinal or umbilical LAD Extremities:  No clubbing or edema. Neurologic:  Alert and  oriented x4;  grossly nonfocal Skin:  Intact without significant lesions or rashes. Psych:  Alert and cooperative. Normal mood and affect.    Kimberly L. Tarri Glenn, MD, MPH 05/19/2020, 5:44  PM

## 2020-05-16 ENCOUNTER — Other Ambulatory Visit: Payer: Self-pay

## 2020-05-16 ENCOUNTER — Ambulatory Visit (HOSPITAL_COMMUNITY)
Admission: RE | Admit: 2020-05-16 | Discharge: 2020-05-16 | Disposition: A | Discharge status: Home / Self Care | Payer: Medicare Other | Source: Ambulatory Visit | Attending: Gastroenterology | Admitting: Gastroenterology

## 2020-05-16 ENCOUNTER — Encounter (HOSPITAL_COMMUNITY): Payer: Self-pay

## 2020-05-16 DIAGNOSIS — R1084 Generalized abdominal pain: Secondary | ICD-10-CM | POA: Diagnosis present

## 2020-05-16 DIAGNOSIS — K5732 Diverticulitis of large intestine without perforation or abscess without bleeding: Secondary | ICD-10-CM | POA: Diagnosis not present

## 2020-05-16 MED ORDER — IOHEXOL 300 MG/ML  SOLN
100.0000 mL | Freq: Once | INTRAMUSCULAR | Status: AC | PRN
  Administered 2020-05-16: 100 mL via INTRAVENOUS

## 2020-05-19 ENCOUNTER — Encounter: Payer: Self-pay | Admitting: Gastroenterology

## 2020-05-27 ENCOUNTER — Telehealth: Payer: Self-pay | Admitting: Gastroenterology

## 2020-05-27 NOTE — Telephone Encounter (Signed)
See mychart message, pt has been scheduled to see Alonza Bogus PA 05/29/20@11am .

## 2020-05-29 ENCOUNTER — Encounter: Payer: Self-pay | Admitting: Gastroenterology

## 2020-05-29 ENCOUNTER — Ambulatory Visit (INDEPENDENT_AMBULATORY_CARE_PROVIDER_SITE_OTHER): Payer: Medicare Other | Admitting: Gastroenterology

## 2020-05-29 VITALS — BP 90/60 | HR 48 | Ht 63.0 in | Wt 128.0 lb

## 2020-05-29 DIAGNOSIS — K5792 Diverticulitis of intestine, part unspecified, without perforation or abscess without bleeding: Secondary | ICD-10-CM | POA: Diagnosis not present

## 2020-05-29 DIAGNOSIS — R1032 Left lower quadrant pain: Secondary | ICD-10-CM

## 2020-05-29 MED ORDER — AMOXICILLIN-POT CLAVULANATE 875-125 MG PO TABS: 1.0000 | ORAL_TABLET | Freq: Three times a day (TID) | ORAL | 0 refills | Status: AC

## 2020-05-29 MED ORDER — MESALAMINE 1.2 G PO TBEC: 4.8000 g | DELAYED_RELEASE_TABLET | Freq: Every day | ORAL | 0 refills | Status: DC

## 2020-05-29 NOTE — Patient Instructions (Signed)
We have sent the following medications to your pharmacy for you to pick up at your convenience: Lialda , Augmentin  START: Lialda 4.8grams( total of 4 capsules) once daily.   START: Augmentin 875mg - Three times daily.   Call office in 1 week-ask for [place] and give updates on symptoms.  If you are age 67 or older, your body mass index should be between 23-30. Your Body mass index is 22.67 kg/m. If this is out of the aforementioned range listed, please consider follow up with your Primary Care Provider.  If you are age 50 or younger, your body mass index should be between 19-25. Your Body mass index is 22.67 kg/m. If this is out of the aformentioned range listed, please consider follow up with your Primary Care Provider.    Thank you for choosing me and [facility].  Alonza Bogus PA-C

## 2020-05-29 NOTE — Progress Notes (Signed)
05/29/2020 Joy Golden [id number] 16-Jul-1955   HISTORY OF PRESENT ILLNESS: This is a 65 year old female who is a patient of Dr. Tarri Glenn.  She has been dealing with issues of diverticulitis since December.  On December 14 she had a CT scan that showed acute uncomplicated diverticulitis involving the mid sigmoid colon.  She was treated with Cipro and Flagyl at that time.  Symptoms never really completely resolved.  Then she was seen again by Dr. Tarri Glenn in early February and was placed on a course of Augmentin 875 mg 3 times daily for 10 days.  She had a CT scan performed again on February 10 that showed persistent findings with acute uncomplicated sigmoid diverticulitis although decreased inflammatory changes in comparison to the prior study.  There was no complications including fluid collection or abscess or perforation.  She completed her Augmentin 4 days ago.  She says that she starts to feel better for the first few days, then symptoms start to come back again and even after completing the antibiotic things do not seem to get better and just start to worsen again.  She tells me that she took an Aleve yesterday and that really seemed to help with the pain and inflammation.  She complains of feeling bloated and distended.  Had a colonoscopy 07/2019 which showed diverticulosis, hemorrhoids, and a rectal tubular adenoma. Erythema present in the area of the diverticulosis but was normal on histopathology.     Past Medical History:  Diagnosis Date  . Allergy   . Anxiety   . Arthritis    Osteoarthritis  . Heart murmur    some say yes, some say no  . HLD (hyperlipidemia)    pt denies 07/12/19  . Hypothyroidism   . Spastic colon    Past Surgical History:  Procedure Laterality Date  . ABDOMINAL HYSTERECTOMY  1983  . AUGMENTATION MAMMAPLASTY    . BACK SURGERY  2002   L4-L5 laminectomy  . BREAST ENHANCEMENT SURGERY  2005  . CARPAL TUNNEL RELEASE Right 2010  . CARPAL TUNNEL WITH CUBITAL  TUNNEL Left   . DE QUERVAIN'S RELEASE Right   . ELBOW SURGERY Left 2013  . GANGLION CYST EXCISION Right 2012  . HARDWARE REMOVAL Right    foot  . HERNIA REPAIR  1970/1980   4 surgeries- abdominal  . PARTIAL HYSTERECTOMY  1976  . REMOVAL OF IMPLANT Right 08/21/2016   RT FOOT  . REPLACEMENT TOTAL KNEE Right 2009  . SACROILIAC JOINT FUSION Left 06/19/2014   Procedure: SACROILIAC JOINT FUSION;  Surgeon: Phylliss Bob, MD;  Location: [facility];  Service: Orthopedics;  Laterality: Left;  Left sided sacroiliac joint fusion  . TOE SURGERY Right    great toe horse stepped on it  . TONSILLECTOMY  1967  . TRIGGER FINGER RELEASE Right    thumb    reports that she quit smoking about 39 years ago. Her smoking use included cigarettes. She quit after 14.00 years of use. She has never used smokeless tobacco. She reports current alcohol use of about 7.0 standard drinks of alcohol per week. She reports that she does not use drugs. family history includes Alzheimer's disease in her father and mother; Crohn's disease in her son; Diabetes in her granddaughter and maternal grandmother; Diverticulitis in her mother; Irritable bowel syndrome in her son; Ovarian cancer in her sister and sister; Tuberculosis in her sister. Allergies  Allergen Reactions  . Mobic [Meloxicam]     Eye irritation  Outpatient Encounter Medications as of 05/29/2020  Medication Sig  . albuterol (PROVENTIL HFA;VENTOLIN HFA) 108 (90 Base) MCG/ACT inhaler Inhale 2 puffs into the lungs every 6 (six) hours as needed for wheezing or shortness of breath.  . ALPRAZolam (XANAX) 0.5 MG tablet Take 1 tablet (0.5 mg total) by mouth 2 (two) times daily as needed for anxiety.  . AMBULATORY NON FORMULARY MEDICATION Bacoba Monieri 1000 mg daily  . b complex vitamins tablet Take 1 tablet by mouth daily. Bio B  . CITICOLINE PO Take 1 capsule by mouth daily.  Marland Kitchen dicyclomine (BENTYL) 10 MG capsule Take 2 capsules (20 mg total) by mouth every 6 (six)  hours as needed for spasms.  Marland Kitchen estradiol (ESTRACE) 0.5 MG tablet Take 1 tablet (0.5 mg total) by mouth daily.  . hydrOXYzine (ATARAX/VISTARIL) 10 MG tablet Take 1 tablet (10 mg total) by mouth 3 (three) times daily as needed.  Marland Kitchen levothyroxine (SYNTHROID) 100 MCG tablet Take 1 tablet (100 mcg total) by mouth 2 (two) times a week.  . levothyroxine (SYNTHROID) 88 MCG tablet Take 1 tablet (88 mcg total) by mouth daily. Needs labs  . Multiple Vitamin (MULTIVITAMIN) tablet Take 1 tablet by mouth daily.  . naproxen (NAPROSYN) 500 MG tablet TAKE 1 TABLET BY MOUTH THREE TIMES DAILY WITH MEALS  . Omega-3 Fatty Acids (OMEGA-3 CF PO) Take by mouth.  . Probiotic Product (PROBIOTIC PO) Take by mouth. Strengtia  . rosuvastatin (CRESTOR) 5 MG tablet Take 1 tablet (5 mg total) by mouth daily.  Marland Kitchen triamcinolone cream (KENALOG) 0.1 % Apply 1 application topically 2 (two) times daily.   No facility-administered encounter medications on file as of 05/29/2020.     REVIEW OF SYSTEMS  : All other systems reviewed and negative except where noted in the History of Present Illness.   PHYSICAL EXAM: BP 90/60   Pulse (!) 48   Ht 5\' 3"  (1.6 m)   Wt 128 lb (58.1 kg)   BMI 22.67 kg/m  General: Well developed white female in no acute distress Head: Normocephalic and atraumatic Eyes:  Sclerae anicteric, conjunctiva pink. Ears: Normal auditory acuity Neck: Supple, no masses.  Lungs: Clear throughout to auscultation Heart: Bradycardic; no M/R/G. Abdomen: Soft, non-distended.  BS present.  Benign and soft abdomen with LLQ TTP. Musculoskeletal: Symmetrical with no gross deformities  Skin: No lesions on visible extremities Extremities: No edema  Neurological: Alert oriented x 4, grossly non-focal Psychological:  Alert and cooperative. Normal mood and affect  ASSESSMENT AND PLAN: *Left lower quadrant abdominal pain with diverticulitis seen on CT scan.  This is been an ongoing issue since December.  Completed a course  of Cipro and Flagyl and then a 10-day course of Augmentin was just completed about 4 days ago.  I am going to place her back on Augmentin 875 mg 3 times daily for 10 more days I am also going to add Lialda 4.8 g daily to see if this possibly treats some associated SCAD as well.  Prescriptions sent to pharmacy.  If this does not help then she may need hospital admission for IV antibiotics as she will have failed outpatient therapy.  Can use antispasmodics prn as well although she does not think that they help much.  We discussed clear liquid diet for the next couple of days and when she has some improvement then will advance to soft low fiber diet.   CC:  Donella Stade, PA-C

## 2020-05-30 ENCOUNTER — Encounter: Payer: Self-pay | Admitting: Physician Assistant

## 2020-05-31 NOTE — Telephone Encounter (Signed)
Come talk to me about this patient. She is wanting a second opinion with someone that could directly admit her to hospital she does not want to go through ED. Maybe [facility]? [facility]?

## 2020-05-31 NOTE — Progress Notes (Signed)
Reviewed and agree with management plans. ? ?Kimberly L. Beavers, MD, MPH  ?

## 2020-06-03 ENCOUNTER — Telehealth: Payer: Self-pay | Admitting: Physician Assistant

## 2020-06-03 DIAGNOSIS — K5792 Diverticulitis of intestine, part unspecified, without perforation or abscess without bleeding: Secondary | ICD-10-CM

## 2020-06-03 NOTE — Telephone Encounter (Signed)
Katlin Bortner returned my call and stated she was looking for a referral to a Engineer, maintenance she does not want to go to the ER and she has already been through 3 rounds of antibiotics. She would like to see a surgeon now and get his opinion. - CF

## 2020-06-03 NOTE — Telephone Encounter (Signed)
I did place referral. If she has worsening pain, spikes a fever, chills, nausea or vomiting. She needs to go to ED.

## 2020-06-04 ENCOUNTER — Telehealth: Payer: Self-pay | Admitting: Gastroenterology

## 2020-06-04 NOTE — Telephone Encounter (Addendum)
I called Daily to let her know if she gets worsening symptoms she will need to go to the ED and she let me know she is scheduled for Thursday 06/06/20 with the Surgeon. She wanted me to let you know she truly appreciates you and thanks you for all your help - CF

## 2020-06-04 NOTE — Telephone Encounter (Signed)
Ok.  I did not say that it would not work or I would not have prescribed it.  I said that I was hoping that it would give her extra on top of the antibiotics but obviously nothing is guaranteed.  There are alternates to Lialda.  Also, I stated that next step should be hospital admission with IV antibiotics.

## 2020-06-04 NOTE — Telephone Encounter (Signed)
Pt is wanting to update the nurse that the medication antibiotics she was prescribed is not helping (pain persists) so the pt is scheduled to see a Psychologist, sport and exercise at [facility].

## 2020-06-04 NOTE — Telephone Encounter (Signed)
Joy Golden the pt states she never picked up the lialda due to cost and she said she was told it may not help anyway.  She also says she is finishing the antibiotics. She did call [facility] and get an appt with a Psychologist, sport and exercise.

## 2020-07-02 ENCOUNTER — Ambulatory Visit: Payer: Medicare Other | Admitting: Physician Assistant

## 2020-07-09 ENCOUNTER — Other Ambulatory Visit: Payer: Self-pay

## 2020-07-09 ENCOUNTER — Encounter: Payer: Self-pay | Admitting: Physician Assistant

## 2020-07-09 ENCOUNTER — Ambulatory Visit (INDEPENDENT_AMBULATORY_CARE_PROVIDER_SITE_OTHER): Payer: Medicare Other | Admitting: Physician Assistant

## 2020-07-09 VITALS — BP 103/65 | HR 54 | Ht 63.0 in | Wt 129.0 lb

## 2020-07-09 DIAGNOSIS — E063 Autoimmune thyroiditis: Secondary | ICD-10-CM | POA: Diagnosis not present

## 2020-07-09 DIAGNOSIS — I700 Atherosclerosis of aorta: Secondary | ICD-10-CM

## 2020-07-09 DIAGNOSIS — Z13820 Encounter for screening for osteoporosis: Secondary | ICD-10-CM | POA: Diagnosis not present

## 2020-07-09 DIAGNOSIS — Z13220 Encounter for screening for lipoid disorders: Secondary | ICD-10-CM | POA: Diagnosis not present

## 2020-07-09 DIAGNOSIS — Z79899 Other long term (current) drug therapy: Secondary | ICD-10-CM

## 2020-07-09 DIAGNOSIS — Z131 Encounter for screening for diabetes mellitus: Secondary | ICD-10-CM

## 2020-07-09 DIAGNOSIS — Z0000 Encounter for general adult medical examination without abnormal findings: Secondary | ICD-10-CM

## 2020-07-09 DIAGNOSIS — Z23 Encounter for immunization: Secondary | ICD-10-CM | POA: Diagnosis not present

## 2020-07-09 DIAGNOSIS — Z780 Asymptomatic menopausal state: Secondary | ICD-10-CM

## 2020-07-09 DIAGNOSIS — Z8719 Personal history of other diseases of the digestive system: Secondary | ICD-10-CM

## 2020-07-09 DIAGNOSIS — K581 Irritable bowel syndrome with constipation: Secondary | ICD-10-CM

## 2020-07-09 DIAGNOSIS — E785 Hyperlipidemia, unspecified: Secondary | ICD-10-CM

## 2020-07-09 DIAGNOSIS — Z1231 Encounter for screening mammogram for malignant neoplasm of breast: Secondary | ICD-10-CM

## 2020-07-09 MED ORDER — ROSUVASTATIN CALCIUM 10 MG PO TABS: 10.0000 mg | ORAL_TABLET | Freq: Every day | ORAL | 3 refills | Status: DC

## 2020-07-09 NOTE — Patient Instructions (Signed)
Health Maintenance After Age 67 After age 67, you are at a higher risk for certain long-term diseases and infections as well as injuries from falls. Falls are a major cause of broken bones and head injuries in people who are older than age 67. Getting regular preventive care can help to keep you healthy and well. Preventive care includes getting regular testing and making lifestyle changes as recommended by your health care provider. Talk with your health care provider about:  Which screenings and tests you should have. A screening is a test that checks for a disease when you have no symptoms.  A diet and exercise plan that is right for you. What should I know about screenings and tests to prevent falls? Screening and testing are the best ways to find a health problem early. Early diagnosis and treatment give you the best chance of managing medical conditions that are common after age 67. Certain conditions and lifestyle choices may make you more likely to have a fall. Your health care provider may recommend:  Regular vision checks. Poor vision and conditions such as cataracts can make you more likely to have a fall. If you wear glasses, make sure to get your prescription updated if your vision changes.  Medicine review. Work with your health care provider to regularly review all of the medicines you are taking, including over-the-counter medicines. Ask your health care provider about any side effects that may make you more likely to have a fall. Tell your health care provider if any medicines that you take make you feel dizzy or sleepy.  Osteoporosis screening. Osteoporosis is a condition that causes the bones to get weaker. This can make the bones weak and cause them to break more easily.  Blood pressure screening. Blood pressure changes and medicines to control blood pressure can make you feel dizzy.  Strength and balance checks. Your health care provider may recommend certain tests to check your  strength and balance while standing, walking, or changing positions.  Foot health exam. Foot pain and numbness, as well as not wearing proper footwear, can make you more likely to have a fall.  Depression screening. You may be more likely to have a fall if you have a fear of falling, feel emotionally low, or feel unable to do activities that you used to do.  Alcohol use screening. Using too much alcohol can affect your balance and may make you more likely to have a fall. What actions can I take to lower my risk of falls? General instructions  Talk with your health care provider about your risks for falling. Tell your health care provider if: ? You fall. Be sure to tell your health care provider about all falls, even ones that seem minor. ? You feel dizzy, sleepy, or off-balance.  Take over-the-counter and prescription medicines only as told by your health care provider. These include any supplements.  Eat a healthy diet and maintain a healthy weight. A healthy diet includes low-fat dairy products, low-fat (lean) meats, and fiber from whole grains, beans, and lots of fruits and vegetables. Home safety  Remove any tripping hazards, such as rugs, cords, and clutter.  Install safety equipment such as grab bars in bathrooms and safety rails on stairs.  Keep rooms and walkways well-lit. Activity  Follow a regular exercise program to stay fit. This will help you maintain your balance. Ask your health care provider what types of exercise are appropriate for you.  If you need a cane or walker,   use it as recommended by your health care provider.  Wear supportive shoes that have nonskid soles.   Lifestyle  Do not drink alcohol if your health care provider tells you not to drink.  If you drink alcohol, limit how much you have: ? 0-1 drink a day for women. ? 0-2 drinks a day for men.  Be aware of how much alcohol is in your drink. In the [place] one drink equals one typical bottle of beer (12  oz), one-half glass of wine (5 oz), or one shot of hard liquor (1 oz).  Do not use any products that contain nicotine or tobacco, such as cigarettes and e-cigarettes. If you need help quitting, ask your health care provider. Summary  Having a healthy lifestyle and getting preventive care can help to protect your health and wellness after age 67.  Screening and testing are the best way to find a health problem early and help you avoid having a fall. Early diagnosis and treatment give you the best chance for managing medical conditions that are more common for people who are older than age 67.  Falls are a major cause of broken bones and head injuries in people who are older than age 67. Take precautions to prevent a fall at home.  Work with your health care provider to learn what changes you can make to improve your health and wellness and to prevent falls. This information is not intended to replace advice given to you by your health care provider. Make sure you discuss any questions you have with your health care provider. Document Revised: 07/14/2018 Document Reviewed: 02/03/2017 Elsevier Patient Education  2021 Elsevier Inc.  

## 2020-07-10 ENCOUNTER — Encounter: Payer: Self-pay | Admitting: Physician Assistant

## 2020-07-10 DIAGNOSIS — E063 Autoimmune thyroiditis: Secondary | ICD-10-CM

## 2020-07-10 LAB — COMPLETE METABOLIC PANEL WITH GFR
AG Ratio: 1.6 (calc) (ref 1.0–2.5)
ALT: 36 U/L — ABNORMAL HIGH (ref 6–29)
AST: 37 U/L — ABNORMAL HIGH (ref 10–35)
Albumin: 4.2 g/dL (ref 3.6–5.1)
Alkaline phosphatase (APISO): 68 U/L (ref 37–153)
BUN: 12 mg/dL (ref 7–25)
CO2: 31 mmol/L (ref 20–32)
Calcium: 9.8 mg/dL (ref 8.6–10.4)
Chloride: 100 mmol/L (ref 98–110)
Creat: 0.84 mg/dL (ref 0.50–0.99)
GFR, Est African American: 84 mL/min/{1.73_m2} (ref >=60)
GFR, Est Non African American: 72 mL/min/{1.73_m2} (ref >=60)
Globulin: 2.6 g/dL (calc) (ref 1.9–3.7)
Glucose, Bld: 85 mg/dL (ref 65–99)
Potassium: 4.4 mmol/L (ref 3.5–5.3)
Sodium: 140 mmol/L (ref 135–146)
Total Bilirubin: 0.8 mg/dL (ref 0.2–1.2)
Total Protein: 6.8 g/dL (ref 6.1–8.1)

## 2020-07-10 LAB — LIPID PANEL W/REFLEX DIRECT LDL
Cholesterol: 204 mg/dL — ABNORMAL HIGH (ref <200)
HDL: 79 mg/dL (ref >=50)
LDL Cholesterol (Calc): 105 mg/dL (calc) — ABNORMAL HIGH
Non-HDL Cholesterol (Calc): 125 mg/dL (calc) (ref <130)
Total CHOL/HDL Ratio: 2.6 (calc) (ref <5.0)
Triglycerides: 107 mg/dL (ref <150)

## 2020-07-10 LAB — TSH: TSH: 0.36 mIU/L — ABNORMAL LOW (ref 0.40–4.50)

## 2020-07-10 NOTE — Progress Notes (Signed)
Subjective:     Joy Golden is a 65 y.o. female and is here for a comprehensive physical exam. The patient reports no problems.  Social History   Socioeconomic History  . Marital status: Divorced    Spouse name: Not on file  . Number of children: 2  . Years of education: Not on file  . Highest education level: Not on file  Occupational History  . Occupation: retired  Tobacco Use  . Smoking status: Former Smoker    Years: 14.00    Types: Cigarettes    Quit date: 1983    Years since quitting: 39.2  . Smokeless tobacco: Never Used  . Tobacco comment: Quit at age 66.  Vaping Use  . Vaping Use: Never used  Substance and Sexual Activity  . Alcohol use: Yes    Alcohol/week: 7.0 standard drinks    Types: 7 Glasses of wine per week    Comment: 2 per day  . Drug use: No  . Sexual activity: Not Currently  Other Topics Concern  . Not on file  Social History Narrative  . Not on file   Social Determinants of Health   Financial Resource Strain: Not on file  Food Insecurity: Not on file  Transportation Needs: Not on file  Physical Activity: Not on file  Stress: Not on file  Social Connections: Not on file  Intimate Partner Violence: Not on file   Health Maintenance  Topic Date Due  . DEXA SCAN  Never done  . INFLUENZA VACCINE  03/08/2022 (Originally 11/04/2020)  . COVID-19 Vaccine (4 - Booster for Pfizer series) 07/18/2020  . MAMMOGRAM  06/20/2021  . PNA vac Low Risk Adult (2 of 2 - PCV13) 07/09/2021  . TETANUS/TDAP  02/17/2025  . COLONOSCOPY (Pts 45-65yrs Insurance coverage will need to be confirmed)  07/12/2026  . Hepatitis C Screening  Completed  . HPV VACCINES  Aged Out    The following portions of the patient's history were reviewed and updated as appropriate: allergies, current medications, past family history, past medical history, past social history, past surgical history and problem list.  Review of Systems A comprehensive review of systems was negative.    Objective:    BP 103/65   Pulse (!) 54   Ht 5\' 3"  (1.6 m)   Wt 129 lb (58.5 kg)   SpO2 99%   BMI 22.85 kg/m  General appearance: alert, cooperative and appears stated age Head: Normocephalic, without obvious abnormality, atraumatic Eyes: conjunctivae/corneas clear. PERRL, EOM's intact. Fundi benign. Ears: normal TM's and external ear canals both ears Nose: Nares normal. Septum midline. Mucosa normal. No drainage or sinus tenderness. Throat: lips, mucosa, and tongue normal; teeth and gums normal Neck: no adenopathy, no carotid bruit, no JVD, supple, symmetrical, trachea midline and thyroid not enlarged, symmetric, no tenderness/mass/nodules Back: symmetric, no curvature. ROM normal. No CVA tenderness. Lungs: clear to auscultation bilaterally Heart: regular rate and rhythm, S1, S2 normal, no murmur, click, rub or gallop Abdomen: soft, non-tender; bowel sounds normal; no masses,  no organomegaly Extremities: extremities normal, atraumatic, no cyanosis or edema Pulses: 2+ and symmetric Skin: Skin color, texture, turgor normal. No rashes or lesions Lymph nodes: Cervical, supraclavicular, and axillary nodes normal. Neurologic: Alert and oriented X 3, normal strength and tone. Normal symmetric reflexes. Normal coordination and gait   .Marland Kitchen Depression screen [facility] 2/9 07/09/2020 06/13/2019 03/08/2017  Decreased Interest 0 0 0  Down, Depressed, Hopeless 0 0 0  PHQ - 2 Score 0 0 0  Altered sleeping - 0 -  Tired, decreased energy - 1 -  Change in appetite - 0 -  Feeling bad or failure about yourself  - 0 -  Trouble concentrating - 0 -  Moving slowly or fidgety/restless - 0 -  Suicidal thoughts - 0 -  PHQ-9 Score - 1 -  Difficult doing work/chores - Not difficult at all -    Assessment:    Healthy female exam.      Plan:    Marland KitchenMarland KitchenDestini was seen today for annual exam.  Diagnoses and all orders for this visit:  Routine physical examination  Lipid screening -     Lipid Panel w/reflex  Direct LDL -     rosuvastatin (CRESTOR) 10 MG tablet; Take 1 tablet (10 mg total) by mouth daily.  Hashimoto's thyroiditis -     TSH  Osteoporosis screening -     DG Bone Density; Future  History of diverticulitis  Visit for screening mammogram -     MM 3D SCREEN BREAST BILATERAL  Medication management -     COMPLETE METABOLIC PANEL WITH GFR  Screening for diabetes mellitus -     COMPLETE METABOLIC PANEL WITH GFR  Post-menopausal -     DG Bone Density; Future  Pneumococcal vaccine administered -     Pneumococcal conjugate vaccine 20-valent (Prevnar 20)  Hyperlipidemia, unspecified hyperlipidemia type -     Lipid Panel w/reflex Direct LDL -     rosuvastatin (CRESTOR) 10 MG tablet; Take 1 tablet (10 mg total) by mouth daily.  Atherosclerosis of aorta (HCC) -     Lipid Panel w/reflex Direct LDL -     rosuvastatin (CRESTOR) 10 MG tablet; Take 1 tablet (10 mg total) by mouth daily.  Irritable bowel syndrome with constipation  .Marland Kitchen Discussed 150 minutes of exercise a week.  Encouraged vitamin D 1000 units and Calcium 1300mg  or 4 servings of dairy a day.  Fasting labs ordered.  PHQ normal.  Mammogram ordered.  Bone density screening ordered.  No need for pap.  Colonoscopy UTD.  pnuemonia 20 given today.  Covid/flu/pnuemonia UTD.  Pt request increase of crestor. Increase to 10mg  daily.   Continue to follow up with GI and discussed warning signs of diverticulitis and prevention.    See After Visit Summary for Counseling Recommendations

## 2020-07-10 NOTE — Progress Notes (Signed)
LDL 105. Crestor 10 sent to pharmacy.  Scant elevation of liver enzymes. Likely on the way down from your diverticulitis flare.  Recheck enzymes in 1 month.  TSH low. Meaning a little HYPER thyroid. I know you are taking 161mcg and 10mcg tablet. Confirm how taking it and suggestion would be to decrease by just a tad to see if we can get you in normal range.

## 2020-07-10 NOTE — Progress Notes (Signed)
Yes that is fine. Recheck last in 6-8 weeks.

## 2020-07-21 ENCOUNTER — Encounter: Payer: Self-pay | Admitting: Physician Assistant

## 2020-07-31 ENCOUNTER — Other Ambulatory Visit: Payer: Self-pay | Admitting: Physician Assistant

## 2020-07-31 DIAGNOSIS — N951 Menopausal and female climacteric states: Secondary | ICD-10-CM

## 2020-07-31 DIAGNOSIS — Z780 Asymptomatic menopausal state: Secondary | ICD-10-CM

## 2020-08-11 HISTORY — PX: COLON SURGERY: SHX602

## 2020-10-28 ENCOUNTER — Encounter: Payer: Self-pay | Admitting: Physician Assistant

## 2020-11-01 LAB — TSH: TSH: 0.82 mIU/L (ref 0.40–4.50)

## 2020-11-04 ENCOUNTER — Ambulatory Visit: Payer: BLUE CROSS/BLUE SHIELD | Admitting: Podiatry

## 2020-11-04 NOTE — Progress Notes (Signed)
TSH looks good. How do you feel? Ok to refill for 6 months if feeling good.

## 2020-11-05 ENCOUNTER — Ambulatory Visit (INDEPENDENT_AMBULATORY_CARE_PROVIDER_SITE_OTHER): Payer: Medicare Other

## 2020-11-05 ENCOUNTER — Encounter: Payer: Self-pay | Admitting: Physician Assistant

## 2020-11-05 ENCOUNTER — Encounter: Payer: Self-pay | Admitting: Podiatry

## 2020-11-05 ENCOUNTER — Ambulatory Visit (INDEPENDENT_AMBULATORY_CARE_PROVIDER_SITE_OTHER): Payer: Medicare Other | Admitting: Podiatry

## 2020-11-05 ENCOUNTER — Other Ambulatory Visit: Payer: Self-pay

## 2020-11-05 DIAGNOSIS — F329 Major depressive disorder, single episode, unspecified: Secondary | ICD-10-CM | POA: Insufficient documentation

## 2020-11-05 DIAGNOSIS — F419 Anxiety disorder, unspecified: Secondary | ICD-10-CM | POA: Insufficient documentation

## 2020-11-05 DIAGNOSIS — Z9109 Other allergy status, other than to drugs and biological substances: Secondary | ICD-10-CM | POA: Insufficient documentation

## 2020-11-05 DIAGNOSIS — M722 Plantar fascial fibromatosis: Secondary | ICD-10-CM | POA: Diagnosis not present

## 2020-11-05 DIAGNOSIS — E063 Autoimmune thyroiditis: Secondary | ICD-10-CM

## 2020-11-05 DIAGNOSIS — R160 Hepatomegaly, not elsewhere classified: Secondary | ICD-10-CM | POA: Insufficient documentation

## 2020-11-05 DIAGNOSIS — M7732 Calcaneal spur, left foot: Secondary | ICD-10-CM | POA: Diagnosis not present

## 2020-11-05 DIAGNOSIS — R5383 Other fatigue: Secondary | ICD-10-CM | POA: Insufficient documentation

## 2020-11-05 MED ORDER — TRIAMCINOLONE ACETONIDE 10 MG/ML IJ SUSP
10.0000 mg | Freq: Once | INTRAMUSCULAR | Status: AC
  Administered 2020-11-05: 10 mg

## 2020-11-05 NOTE — Patient Instructions (Signed)
If was nice to meet you today. If you have any questions or any further concerns, please feel fee to give me a call. You can call our office at [phone] or please feel fee to send me a message through MyChart.   ----  For instructions on how to put on your Plantar Fascial Brace, please visit [email or web address]  ---     Plantar Fasciitis (Heel Spur Syndrome) with Rehab The plantar fascia is a fibrous, ligament-like, soft-tissue structure that spans the bottom of the foot. Plantar fasciitis is a condition that causes pain in the foot due to inflammation of the tissue. SYMPTOMS  Pain and tenderness on the underneath side of the foot. Pain that worsens with standing or walking. CAUSES  Plantar fasciitis is caused by irritation and injury to the plantar fascia on the underneath side of the foot. Common mechanisms of injury include: Direct trauma to bottom of the foot. Damage to a small nerve that runs under the foot where the main fascia attaches to the heel bone. Stress placed on the plantar fascia due to bone spurs. RISK INCREASES WITH:  Activities that place stress on the plantar fascia (running, jumping, pivoting, or cutting). Poor strength and flexibility. Improperly fitted shoes. Tight calf muscles. Flat feet. Failure to warm-up properly before activity. Obesity. PREVENTION Warm up and stretch properly before activity. Allow for adequate recovery between workouts. Maintain physical fitness: Strength, flexibility, and endurance. Cardiovascular fitness. Maintain a health body weight. Avoid stress on the plantar fascia. Wear properly fitted shoes, including arch supports for individuals who have flat feet.  PROGNOSIS  If treated properly, then the symptoms of plantar fasciitis usually resolve without surgery. However, occasionally surgery is necessary.  RELATED COMPLICATIONS  Recurrent symptoms that may result in a chronic condition. Problems of the lower back  that are caused by compensating for the injury, such as limping. Pain or weakness of the foot during push-off following surgery. Chronic inflammation, scarring, and partial or complete fascia tear, occurring more often from repeated injections.  TREATMENT  Treatment initially involves the use of ice and medication to help reduce pain and inflammation. The use of strengthening and stretching exercises may help reduce pain with activity, especially stretches of the Achilles tendon. These exercises may be performed at home or with a therapist. Your caregiver may recommend that you use heel cups of arch supports to help reduce stress on the plantar fascia. Occasionally, corticosteroid injections are given to reduce inflammation. If symptoms persist for greater than 6 months despite non-surgical (conservative), then surgery may be recommended.   MEDICATION  If pain medication is necessary, then nonsteroidal anti-inflammatory medications, such as aspirin and ibuprofen, or other minor pain relievers, such as acetaminophen, are often recommended. Do not take pain medication within 7 days before surgery. Prescription pain relievers may be given if deemed necessary by your caregiver. Use only as directed and only as much as you need. Corticosteroid injections may be given by your caregiver. These injections should be reserved for the most serious cases, because they may only be given a certain number of times.  HEAT AND COLD Cold treatment (icing) relieves pain and reduces inflammation. Cold treatment should be applied for 10 to 15 minutes every 2 to 3 hours for inflammation and pain and immediately after any activity that aggravates your symptoms. Use ice packs or massage the area with a piece of ice (ice massage). Heat treatment may be used prior to performing the stretching and strengthening activities prescribed by your caregiver,   physical therapist, or athletic trainer. Use a heat pack or soak the injury  in warm water.  SEEK IMMEDIATE MEDICAL CARE IF: Treatment seems to offer no benefit, or the condition worsens. Any medications produce adverse side effects.  EXERCISES- RANGE OF MOTION (ROM) AND STRETCHING EXERCISES - Plantar Fasciitis (Heel Spur Syndrome) These exercises may help you when beginning to rehabilitate your injury. Your symptoms may resolve with or without further involvement from your physician, physical therapist or athletic trainer. While completing these exercises, remember:  Restoring tissue flexibility helps normal motion to return to the joints. This allows healthier, less painful movement and activity. An effective stretch should be held for at least 30 seconds. A stretch should never be painful. You should only feel a gentle lengthening or release in the stretched tissue.  RANGE OF MOTION - Toe Extension, Flexion Sit with your right / left leg crossed over your opposite knee. Grasp your toes and gently pull them back toward the top of your foot. You should feel a stretch on the bottom of your toes and/or foot. Hold this stretch for 10 seconds. Now, gently pull your toes toward the bottom of your foot. You should feel a stretch on the top of your toes and or foot. Hold this stretch for 10 seconds. Repeat  times. Complete this stretch 3 times per day.   RANGE OF MOTION - Ankle Dorsiflexion, Active Assisted Remove shoes and sit on a chair that is preferably not on a carpeted surface. Place right / left foot under knee. Extend your opposite leg for support. Keeping your heel down, slide your right / left foot back toward the chair until you feel a stretch at your ankle or calf. If you do not feel a stretch, slide your bottom forward to the edge of the chair, while still keeping your heel down. Hold this stretch for 10 seconds. Repeat 3 times. Complete this stretch 2 times per day.   STRETCH  Gastroc, Standing Place hands on wall. Extend right / left leg, keeping the  front knee somewhat bent. Slightly point your toes inward on your back foot. Keeping your right / left heel on the floor and your knee straight, shift your weight toward the wall, not allowing your back to arch. You should feel a gentle stretch in the right / left calf. Hold this position for 10 seconds. Repeat 3 times. Complete this stretch 2 times per day.  STRETCH  Soleus, Standing Place hands on wall. Extend right / left leg, keeping the other knee somewhat bent. Slightly point your toes inward on your back foot. Keep your right / left heel on the floor, bend your back knee, and slightly shift your weight over the back leg so that you feel a gentle stretch deep in your back calf. Hold this position for 10 seconds. Repeat 3 times. Complete this stretch 2 times per day.  STRETCH  Gastrocsoleus, Standing  Note: This exercise can place a lot of stress on your foot and ankle. Please complete this exercise only if specifically instructed by your caregiver.  Place the ball of your right / left foot on a step, keeping your other foot firmly on the same step. Hold on to the wall or a rail for balance. Slowly lift your other foot, allowing your body weight to press your heel down over the edge of the step. You should feel a stretch in your right / left calf. Hold this position for 10 seconds. Repeat this exercise with a   slight bend in your right / left knee. Repeat 3 times. Complete this stretch 2 times per day.   STRENGTHENING EXERCISES - Plantar Fasciitis (Heel Spur Syndrome)  These exercises may help you when beginning to rehabilitate your injury. They may resolve your symptoms with or without further involvement from your physician, physical therapist or athletic trainer. While completing these exercises, remember:  Muscles can gain both the endurance and the strength needed for everyday activities through controlled exercises. Complete these exercises as instructed by your physician,  physical therapist or athletic trainer. Progress the resistance and repetitions only as guided.  STRENGTH - Towel Curls Sit in a chair positioned on a non-carpeted surface. Place your foot on a towel, keeping your heel on the floor. Pull the towel toward your heel by only curling your toes. Keep your heel on the floor. Repeat 3 times. Complete this exercise 2 times per day.  STRENGTH - Ankle Inversion Secure one end of a rubber exercise band/tubing to a fixed object (table, pole). Loop the other end around your foot just before your toes. Place your fists between your knees. This will focus your strengthening at your ankle. Slowly, pull your big toe up and in, making sure the band/tubing is positioned to resist the entire motion. Hold this position for 10 seconds. Have your muscles resist the band/tubing as it slowly pulls your foot back to the starting position. Repeat 3 times. Complete this exercises 2 times per day.  Document Released: 03/23/2005 Document Revised: 06/15/2011 Document Reviewed: 07/05/2008 [employer] Patient Information 2014 [employer].  

## 2020-11-06 ENCOUNTER — Encounter: Payer: Self-pay | Admitting: Physician Assistant

## 2020-11-06 MED ORDER — LEVOTHYROXINE SODIUM 88 MCG PO TABS: 88.0000 ug | ORAL_TABLET | Freq: Every day | ORAL | 1 refills | Status: DC

## 2020-11-07 MED ORDER — TRIAMCINOLONE ACETONIDE 0.1 % EX CREA: 1.0000 "application " | TOPICAL_CREAM | Freq: Two times a day (BID) | CUTANEOUS | 1 refills | Status: DC

## 2020-11-08 NOTE — Progress Notes (Addendum)
Subjective:   Patient ID: Joy Golden, female   DOB: 65 y.o.   MRN: [id number]   HPI 65 year old female presents the office with concerns of left heel pain which is been ongoing for last 2 months.  She describes a throbbing sensation.  She states that it hurts and when she first gets up and after walking.  No numbness or tingling.  She is not seeing significant swelling.  No recent injury.   Recently had a bowel resection on Aug 08, 2020.  History of right first MPJ arthrodesis over 10 years ago.   Review of Systems  All other systems reviewed and are negative.  Past Medical History:  Diagnosis Date   Allergy    Anxiety    Arthritis    Osteoarthritis   Heart murmur    some say yes, some say no   HLD (hyperlipidemia)    pt denies 07/12/19   Hypothyroidism    Spastic colon     Past Surgical History:  Procedure Laterality Date   ABDOMINAL HYSTERECTOMY  1983   AUGMENTATION MAMMAPLASTY     BACK SURGERY  2002   L4-L5 laminectomy   BREAST ENHANCEMENT SURGERY  2005   CARPAL TUNNEL RELEASE Right 2010   CARPAL TUNNEL WITH CUBITAL TUNNEL Left    DE QUERVAIN'S RELEASE Right    ELBOW SURGERY Left 2013   GANGLION CYST EXCISION Right 2012   HARDWARE REMOVAL Right    foot   HERNIA REPAIR  1970/1980   4 surgeries- abdominal   PARTIAL HYSTERECTOMY  1976   REMOVAL OF IMPLANT Right 08/21/2016   RT FOOT   REPLACEMENT TOTAL KNEE Right 2009   SACROILIAC JOINT FUSION Left 06/19/2014   Procedure: SACROILIAC JOINT FUSION;  Surgeon: Phylliss Bob, MD;  Location: [facility];  Service: Orthopedics;  Laterality: Left;  Left sided sacroiliac joint fusion   TOE SURGERY Right    great toe horse stepped on it   TONSILLECTOMY  1967   TRIGGER FINGER RELEASE Right    thumb     Current Outpatient Medications:    ALPRAZolam (XANAX) 0.5 MG tablet, Take by mouth., Disp: , Rfl:    levothyroxine (SYNTHROID) 100 MCG tablet, once a week. WEDNESDAY, Disp: , Rfl:    Polyethylene Glycol 3350 (MIRALAX PO), Take  by mouth., Disp: , Rfl:    Aloe Vera 500 MG CAPS, Take by mouth., Disp: , Rfl:    Alpha-Lipoic Acid 600 MG TABS, Take by mouth., Disp: , Rfl:    ALPRAZolam (XANAX) 0.5 MG tablet, Take 1 tablet (0.5 mg total) by mouth 2 (two) times daily as needed for anxiety., Disp: 30 tablet, Rfl: 1   AMBULATORY NON FORMULARY MEDICATION, Bacoba Monieri 1000 mg daily, Disp: , Rfl:    b complex vitamins tablet, Take 1 tablet by mouth daily. Bio B, Disp: , Rfl:    Cholecalciferol (VITAMIN D3) 10 MCG (400 UNIT) tablet, Take by mouth., Disp: , Rfl:    Citicoline 500 MG CAPS, Take by mouth., Disp: , Rfl:    CITICOLINE PO, Take 1 capsule by mouth daily., Disp: , Rfl:    Coconut Oil 1000 MG CAPS, Take by mouth., Disp: , Rfl:    Coenzyme Q10 400 MG CAPS, Take by mouth., Disp: , Rfl:    estradiol (ESTRACE) 0.5 MG tablet, Take 1 tablet by mouth once daily, Disp: 90 tablet, Rfl: 0   fluticasone (FLONASE) 50 MCG/ACT nasal spray, Place into the nose., Disp: , Rfl:    hydrOXYzine (ATARAX/VISTARIL) 10  MG tablet, Take 1 tablet (10 mg total) by mouth 3 (three) times daily as needed., Disp: 90 tablet, Rfl: 1   levothyroxine (SYNTHROID) 100 MCG tablet, Take 1 tablet (100 mcg total) by mouth 2 (two) times a week., Disp: 24 tablet, Rfl: 3   levothyroxine (SYNTHROID) 88 MCG tablet, Take 1 tablet (88 mcg total) by mouth daily., Disp: 90 tablet, Rfl: 1   moxifloxacin (AVELOX) 400 MG tablet, Take 400 mg by mouth daily., Disp: , Rfl:    Multiple Vitamin (MULTIVITAMIN) tablet, Take 1 tablet by mouth daily., Disp: , Rfl:    Omega-3 Fatty Acids (OMEGA-3 CF PO), Take by mouth., Disp: , Rfl:    Probiotic Product (PROBIOTIC PO), Take by mouth. Strengtia, Disp: , Rfl:    pseudoephedrine (SUDAFED) 30 MG tablet, Take by mouth., Disp: , Rfl:    rosuvastatin (CRESTOR) 10 MG tablet, Take 1 tablet (10 mg total) by mouth daily., Disp: 90 tablet, Rfl: 3   triamcinolone (KENALOG) 0.025 % cream, Apply topically., Disp: , Rfl:    triamcinolone cream  (KENALOG) 0.1 %, Apply 1 application topically 2 (two) times daily., Disp: 60 g, Rfl: 1   Turmeric 500 MG TABS, Take by mouth., Disp: , Rfl:   Allergies  Allergen Reactions   Mobic [Meloxicam]     Eye irritation    Social History   Socioeconomic History   Marital status: Divorced    Spouse name: Not on file   Number of children: 2   Years of education: Not on file   Highest education level: Not on file  Occupational History   Occupation: retired  Tobacco Use   Smoking status: Former    Years: 14.00    Types: Cigarettes    Quit date: 1983    Years since quitting: 39.6   Smokeless tobacco: Never   Tobacco comments:    Quit at age 40.  Vaping Use   Vaping Use: Never used  Substance and Sexual Activity   Alcohol use: Yes    Alcohol/week: 7.0 standard drinks    Types: 7 Glasses of wine per week    Comment: 2 per day   Drug use: No   Sexual activity: Not Currently  Other Topics Concern   Not on file  Social History Narrative   Not on file   Social Determinants of Health   Financial Resource Strain: Not on file  Food Insecurity: Not on file  Transportation Needs: Not on file  Physical Activity: Not on file  Stress: Not on file  Social Connections: Not on file  Intimate Partner Violence: Not on file             Objective:  Physical Exam  General: AAO x3, NAD  Dermatological: Skin is warm, dry and supple bilateral.  There are no open sores, no preulcerative lesions, no rash or signs of infection present.  Vascular: Dorsalis Pedis artery and Posterior Tibial artery pedal pulses are 2/4 bilateral with immedate capillary fill time. Pedal hair growth present. No varicosities and no lower extremity edema present bilateral. There is no pain with calf compression, swelling, warmth, erythema.   Neruologic: Grossly intact via light touch bilateral. Negative tinel sign.   Musculoskeletal: Tenderness to palpation along the plantar medial tubercle of the calcaneus at  the insertion of plantar fascia on the left foot. There is no pain along the course of the plantar fascia within the arch of the foot. Plantar fascia appears to be intact. There is no pain with lateral  compression of the calcaneus or pain with vibratory sensation. There is no pain along the course or insertion of the achilles tendon. No other areas of tenderness to bilateral lower extremities.. Muscular strength 5/5 in all groups tested bilateral.  Gait: Unassisted, Nonantalgic.       Assessment:   Left heel pain, Planter fasciitis     Plan:  -Treatment options discussed including all alternatives, risks, and complications -Etiology of symptoms were discussed -X-rays were obtained and reviewed with the patient.  Evidence of acute fracture.  Very minimal inferior calcaneal spurring is present.  Arthritic changes present first MPJ. -Steroid injection performed.  See procedure note below. -Plantar fascial brace was dispensed -Discussed stretching exercises, icing daily. -Discussed shoe modifications and good arch supports.     Trula Slade DPM

## 2020-11-29 ENCOUNTER — Other Ambulatory Visit: Payer: Self-pay | Admitting: Physician Assistant

## 2020-11-29 DIAGNOSIS — N951 Menopausal and female climacteric states: Secondary | ICD-10-CM

## 2020-11-29 DIAGNOSIS — Z780 Asymptomatic menopausal state: Secondary | ICD-10-CM

## 2020-12-03 ENCOUNTER — Ambulatory Visit: Payer: Medicare Other | Admitting: Podiatry

## 2020-12-04 ENCOUNTER — Encounter: Payer: Self-pay | Admitting: Podiatry

## 2020-12-04 ENCOUNTER — Encounter: Payer: Self-pay | Admitting: Physician Assistant

## 2020-12-04 ENCOUNTER — Ambulatory Visit (INDEPENDENT_AMBULATORY_CARE_PROVIDER_SITE_OTHER): Payer: Medicare Other | Admitting: Physician Assistant

## 2020-12-04 ENCOUNTER — Ambulatory Visit (INDEPENDENT_AMBULATORY_CARE_PROVIDER_SITE_OTHER): Payer: Medicare Other

## 2020-12-04 DIAGNOSIS — G8929 Other chronic pain: Secondary | ICD-10-CM | POA: Diagnosis not present

## 2020-12-04 DIAGNOSIS — M25561 Pain in right knee: Secondary | ICD-10-CM | POA: Diagnosis not present

## 2020-12-04 NOTE — Progress Notes (Signed)
Office Visit Note   Patient: Joy Golden           Date of Birth: 04-24-1955           MRN: [id number] Visit Date: 12/04/2020              Requested by: Donella Stade, PA-C [address] [place],  [place] [id number] PCP: Donella Stade, PA-C   Assessment & Plan: Visit Diagnoses:  1. Chronic pain of right knee     Plan: We will send patient to physical therapy for quad strengthening right knee.  See her back in 6 weeks to see how she is doing overall she still has some instability with valgus varus stressing anterior drawer recommend upsizing the polyliner.  Questions were encouraged and answered at length.  Follow-Up Instructions: Return in about 6 weeks (around 01/15/2021).   Orders:  Orders Placed This Encounter  Procedures   XR Knee 1-2 Views Right   No orders of the defined types were placed in this encounter.     Procedures: No procedures performed   Clinical Data: No additional findings.   Subjective: Chief Complaint  Patient presents with   Right Knee - Pain    HPI Joy Golden is a pleasant 65 year old female were seen for the first time for right knee pain.  She has history of right total knee arthroplasty some 15 years ago and [place].  She states that over the last year she has had increased soreness mostly medial aspect of the knee and pain with weightbearing at times.  She feels a clunk in the knee at times.  Some days after the knee kind of gives way she will have soreness for several days in the knee and will return back to somewhat normal.  She has had no new injury to the knee.  She has had recent bowel surgery and states that she is been less active.  Review of Systems  Constitutional:  Negative for chills and fever.    Objective: Vital Signs: There were no vitals taken for this visit.  Physical Exam Constitutional:      Appearance: She is normal weight. She is not ill-appearing or diaphoretic.  Pulmonary:      Effort: Pulmonary effort is normal.  Neurological:     Mental Status: She is alert and oriented to person, place, and time.  Psychiatric:        Mood and Affect: Mood normal.    Ortho Exam Bilateral knees :  No abnormal warmth erythema or effusion of either knee.  Right knee with well-healed surgical incision.  Left knee no instability valgus varus stressing.  Right knee there is instability varus stressing but more so with valgus stressing of the knee.  Anterior drawer on the right is positive.  Quad atrophy bilaterally. Specialty Comments:  No specialty comments available.  Imaging: XR Knee 1-2 Views Right  Result Date: 12/04/2020 Right knee 2 views: No acute fracture . Status post right knee arthroplasty without hardware or loosening. Knee well located.     PMFS History: Patient Active Problem List   Diagnosis Date Noted   Anxiety 11/05/2020   Depressive disorder 11/05/2020   Environmental allergies 11/05/2020   Fatigue 11/05/2020   Large liver 11/05/2020   LLQ abdominal pain 05/29/2020   Diverticulitis 03/19/2020   Atherosclerosis of aorta ([place]) 03/19/2020   Positive colorectal cancer screening using Cologuard test 06/30/2019   Family history of Alzheimer's disease 08/17/2018  Menopause 08/16/2018   Stone of salivary gland or duct 07/08/2017   Hashimoto's thyroiditis 04/05/2017   Milia 03/11/2017   Irritable bowel syndrome with constipation 03/11/2017   Right foot pain 08/19/2016   Metatarsalgia of right foot 05/04/2016   Itching 03/02/2016   Hyperlipidemia 02/19/2015   Seborrheic keratosis 07/18/2014   Thyroid activity decreased 04/11/2014   Lumbar radiculopathy 04/11/2014   Left-sided low back pain with left-sided sciatica 04/11/2014   Lumbar herniated disc 04/11/2014   Past Medical History:  Diagnosis Date   Allergy    Anxiety    Arthritis    Osteoarthritis   Heart murmur    some say yes, some say no   HLD (hyperlipidemia)    pt denies 07/12/19    Hypothyroidism    Spastic colon     Family History  Problem Relation Age of Onset   Alzheimer's disease Mother    Diverticulitis Mother    Tuberculosis Sister    Ovarian cancer Sister    Diabetes Maternal Grandmother    Ovarian cancer Sister    Alzheimer's disease Father    Crohn's disease Son    Irritable bowel syndrome Son    Diabetes Granddaughter     Past Surgical History:  Procedure Laterality Date   ABDOMINAL HYSTERECTOMY  1983   AUGMENTATION MAMMAPLASTY     BACK SURGERY  2002   L4-L5 laminectomy   BREAST ENHANCEMENT SURGERY  2005   CARPAL TUNNEL RELEASE Right 2010   CARPAL TUNNEL WITH CUBITAL TUNNEL Left    DE QUERVAIN'S RELEASE Right    ELBOW SURGERY Left 2013   GANGLION CYST EXCISION Right 2012   HARDWARE REMOVAL Right    foot   HERNIA REPAIR  1970/1980   4 surgeries- abdominal   PARTIAL HYSTERECTOMY  1976   REMOVAL OF IMPLANT Right 08/21/2016   RT FOOT   REPLACEMENT TOTAL KNEE Right 2009   SACROILIAC JOINT FUSION Left 06/19/2014   Procedure: SACROILIAC JOINT FUSION;  Surgeon: Phylliss Bob, MD;  Location: [facility];  Service: Orthopedics;  Laterality: Left;  Left sided sacroiliac joint fusion   TOE SURGERY Right    great toe horse stepped on it   TONSILLECTOMY  1967   TRIGGER FINGER RELEASE Right    thumb   Social History   Occupational History   Occupation: retired  Tobacco Use   Smoking status: Former    Years: 14.00    Types: Cigarettes    Quit date: 1983    Years since quitting: 39.6   Smokeless tobacco: Never   Tobacco comments:    Quit at age 1.  Vaping Use   Vaping Use: Never used  Substance and Sexual Activity   Alcohol use: Yes    Alcohol/week: 7.0 standard drinks    Types: 7 Glasses of wine per week    Comment: 2 per day   Drug use: No   Sexual activity: Not Currently

## 2020-12-05 ENCOUNTER — Encounter: Payer: Self-pay | Admitting: Physician Assistant

## 2020-12-05 NOTE — Telephone Encounter (Signed)
Can you help with this?

## 2020-12-06 ENCOUNTER — Encounter: Payer: Self-pay | Admitting: Physician Assistant

## 2020-12-10 ENCOUNTER — Encounter: Payer: Self-pay | Admitting: Physician Assistant

## 2020-12-10 MED ORDER — HYDROXYZINE HCL 10 MG PO TABS: 10.0000 mg | ORAL_TABLET | Freq: Three times a day (TID) | ORAL | 1 refills | Status: AC | PRN

## 2020-12-10 NOTE — Telephone Encounter (Signed)
Pt has not had the medication since 2019.  Please review for appropriateness of refill at this time.  Charyl Bigger, CMA

## 2020-12-10 NOTE — Telephone Encounter (Signed)
I called patient to let her know this has been handled.

## 2021-01-15 ENCOUNTER — Ambulatory Visit (INDEPENDENT_AMBULATORY_CARE_PROVIDER_SITE_OTHER): Payer: Medicare Other | Admitting: Physician Assistant

## 2021-01-15 ENCOUNTER — Encounter: Payer: Self-pay | Admitting: Physician Assistant

## 2021-01-15 DIAGNOSIS — M7660 Achilles tendinitis, unspecified leg: Secondary | ICD-10-CM

## 2021-01-15 DIAGNOSIS — M25561 Pain in right knee: Secondary | ICD-10-CM | POA: Diagnosis not present

## 2021-01-15 DIAGNOSIS — G8929 Other chronic pain: Secondary | ICD-10-CM

## 2021-01-15 MED ORDER — METHYLPREDNISOLONE 4 MG PO TABS: ORAL_TABLET | ORAL | 0 refills | Status: DC

## 2021-01-15 NOTE — Progress Notes (Addendum)
HPI: [name] returns today for follow-up of her right knee.  Again she is someone who has history of right total knee arthro[place] 15 [place].  Said increasing pain in the knee mainly over the medial aspect.  She had been feeling a clunk in the knee at times.  She states she has not felt this well for some time.  Is been going to physical therapy.  Main complaint today is left foot and heel pain.  She reports that therapy has been working with her on stretching her foot for plantar fasciitis and she is doing home exercise program.  She did receive a cortisone injection in her foot elsewhere in August.  She does feel she is not getting any better as far as her foot goes.  Review of systems: See HPI otherwise negative.  Physical exam: General: Well-developed well-nourished female no acute distress mood and affect appropriate Left heel she has bruising over the posterior aspect of the heel.  Her Achilles is intact.  She is tender over the lateral insertion of the Achilles.  No tenderness over the knee peroneal tendons.  No tenderness over the plantar fascia.  Gastrocs was not tight.  Right knee surgical incisions well-healed.  With valgus varus stressing she has slight instability at full extension and 30 degrees of flexion there is no plate in the knee whatsoever.  Anterior drawer is negative right knee today.  Impression: Right knee pain Left insertional Achilles tendinitis  Plan: Given the fact that her knee is overall feeling better and is more stable on exam today recommend she continue therapy.  We can always upsize the polyethylene knee 2.  In regards to the left insertional Achilles tendinitis we will place her in a cam walker boot at night 16th inch heel lift for the next 2 weeks.  Then she will start wearing the 9/16inch heel lifts in both shoes.  Continue physical therapy for quad strengthening for the right knee and also add therapy for left insertional Achilles  tendinitis.  We will see her back in 6 weeks sooner if there is any questions concerns.  She is given a Medrol Dosepak to help with the inflammation in the Achilles.  She will also begin using Voltaren gel 2 g 4 times daily to the Achilles insertion area.

## 2021-02-10 ENCOUNTER — Ambulatory Visit: Payer: Self-pay

## 2021-02-10 ENCOUNTER — Ambulatory Visit (INDEPENDENT_AMBULATORY_CARE_PROVIDER_SITE_OTHER): Payer: Medicare Other | Admitting: Physician Assistant

## 2021-02-10 ENCOUNTER — Encounter: Payer: Self-pay | Admitting: Physician Assistant

## 2021-02-10 DIAGNOSIS — M7672 Peroneal tendinitis, left leg: Secondary | ICD-10-CM

## 2021-02-10 DIAGNOSIS — M7660 Achilles tendinitis, unspecified leg: Secondary | ICD-10-CM

## 2021-02-10 MED ORDER — LIDOCAINE HCL 1 % IJ SOLN
0.5000 mL | INTRAMUSCULAR | Status: AC | PRN
  Administered 2021-02-10: .5 mL

## 2021-02-10 MED ORDER — METHYLPREDNISOLONE ACETATE 40 MG/ML IJ SUSP
0.5000 mL | INTRAMUSCULAR | Status: AC | PRN
  Administered 2021-02-10: .5 mL via INTRAMUSCULAR

## 2021-02-10 NOTE — Progress Notes (Signed)
Office Visit Note   Patient: Joy Golden           Date of Birth: 12-09-1955           MRN: [id number] Visit Date: 02/10/2021              Requested by: Donella Stade, PA-C [address] [place],  [place] [address] PCP: Donella Stade, PA-C   Assessment & Plan: Visit Diagnoses:  1. Insertional Achilles tendinopathy   2. Peroneal tendinitis of left lower leg     Plan: She tolerated the injection well today left foot.  We will have her obtain a facility type orthotic with a lateral hindfoot wedge.  She will let us know if her pain persist or becomes worse over the next 7 to 10 days.  Questions encouraged and answered at length.  My neck step will be MRI to rule out peroneal tendon tear.  Questions were encouraged and answered at length.  Follow-Up Instructions: Return if symptoms worsen or fail to improve.   Orders:  Orders Placed This Encounter  Procedures   Trigger Point Inj   XR Os Calcis Left   No orders of the defined types were placed in this encounter.     Procedures: Trigger Point Inj  Date/Time: 02/10/2021 11:44 AM Performed by: Pete Pelt, PA-C Authorized by: Pete Pelt, PA-C   Consent Given by:  Patient Site marked: the procedure site was marked   Timeout: prior to procedure the correct patient, procedure, and site was verified   Total # of Trigger Points:  1 Location: lower extremity   Location comment:  Left foot test proximal to the CC joint Needle Size:  25 G Approach:  Lateral Medications #1:  0.5 mL lidocaine 1 %; 0.5 mL methylPREDNISolone acetate 40 MG/ML   Clinical Data: No additional findings.   Subjective: Chief Complaint  Patient presents with   Left Heel - Pain, Follow-up    HPI Joy Golden returns today again with left heel foot pain.  States pain is constant.  She does feel that the Medrol Dosepak began to help but then whenever she transitioned out of the boot into regular shoe her pain dramatically  increased.  She has had no new injury.  Again her pains been ongoing since June of this year.  She saw podiatrist here in town who gave her an injection for plantar fasciitis.  When I saw her recently she was more tender over the Achilles insertion.  She comes in today point lateral aspect of her heel.  She is concerned about a healed fracture.  Prior x-rays had shown no heel fracture or evidence of bony abnormality. Review of Systems See HPI.  Objective: Vital Signs: There were no vitals taken for this visit.  Physical Exam General well-developed well-nourished female no acute distress.  Ambulates in a cam walker boot with no assistive device. Ortho Exam Left foot no rashes skin lesions ulcerations.  Dorsal pedal pulse 2+.  She has slight tenderness over the lateral Achilles insertion but maximal tenderness is near the CC joint.  She has no pain with eversion of foot against resistance.  Flexion of the great toe against resistance causes no discomfort.  She is nontender over the medial tubercle of calcaneus.  Full range of motion of the ankle without pain. Specialty Comments:  No specialty comments available.  Imaging: XR Os Calcis Left  Result Date: 02/10/2021 Left heel 2 views: No acute fracture  or bony abnormality.     PMFS History: Patient Active Problem List   Diagnosis Date Noted   LLQ abdominal pain 05/29/2020   Diverticulitis 03/19/2020   Atherosclerosis of aorta ([facility]) 03/19/2020   Positive colorectal cancer screening using Cologuard test 06/30/2019   Family history of Alzheimer's disease 08/17/2018   Menopause 08/16/2018   Stone of salivary gland or duct 07/08/2017   Hashimoto's thyroiditis 04/05/2017   Milia 03/11/2017   Irritable bowel syndrome with constipation 03/11/2017   Right foot pain 08/19/2016   Metatarsalgia of right foot 05/04/2016   Itching 03/02/2016   Hyperlipidemia 02/19/2015   Seborrheic keratosis 07/18/2014   Thyroid activity decreased 04/11/2014    Lumbar radiculopathy 04/11/2014   Left-sided low back pain with left-sided sciatica 04/11/2014   Lumbar herniated disc 04/11/2014   Past Medical History:  Diagnosis Date   Allergy    Anxiety    Arthritis    Osteoarthritis   Heart murmur    some say yes, some say no   HLD (hyperlipidemia)    pt denies 07/12/19   Hypothyroidism    Spastic colon     Family History  Problem Relation Age of Onset   Alzheimer's disease Mother    Diverticulitis Mother    Tuberculosis Sister    Ovarian cancer Sister    Diabetes Maternal Grandmother    Ovarian cancer Sister    Alzheimer's disease Father    Crohn's disease Son    Irritable bowel syndrome Son    Diabetes Granddaughter     Past Surgical History:  Procedure Laterality Date   ABDOMINAL HYSTERECTOMY  1983   AUGMENTATION MAMMAPLASTY     BACK SURGERY  2002   L4-L5 laminectomy   BREAST ENHANCEMENT SURGERY  2005   CARPAL TUNNEL RELEASE Right 2010   CARPAL TUNNEL WITH CUBITAL TUNNEL Left    DE QUERVAIN'S RELEASE Right    ELBOW SURGERY Left 2013   GANGLION CYST EXCISION Right 2012   HARDWARE REMOVAL Right    foot   HERNIA REPAIR  1970/1980   4 surgeries- abdominal   PARTIAL HYSTERECTOMY  1976   REMOVAL OF IMPLANT Right 08/21/2016   RT FOOT   REPLACEMENT TOTAL KNEE Right 2009   SACROILIAC JOINT FUSION Left 06/19/2014   Procedure: SACROILIAC JOINT FUSION;  Surgeon: Phylliss Bob, MD;  Location: [facility];  Service: Orthopedics;  Laterality: Left;  Left sided sacroiliac joint fusion   TOE SURGERY Right    great toe horse stepped on it   TONSILLECTOMY  1967   TRIGGER FINGER RELEASE Right    thumb   Social History   Occupational History   Occupation: retired  Tobacco Use   Smoking status: Former    Years: 14.00    Types: Cigarettes    Quit date: 1983    Years since quitting: 39.8   Smokeless tobacco: Never   Tobacco comments:    Quit at age 41.  Vaping Use   Vaping Use: Never used  Substance and Sexual Activity   Alcohol use:  Yes    Alcohol/week: 7.0 standard drinks    Types: 7 Glasses of wine per week    Comment: 2 per day   Drug use: No   Sexual activity: Not Currently

## 2021-02-11 ENCOUNTER — Telehealth: Payer: Self-pay | Admitting: Neurology

## 2021-02-11 DIAGNOSIS — E063 Autoimmune thyroiditis: Secondary | ICD-10-CM

## 2021-02-11 DIAGNOSIS — E039 Hypothyroidism, unspecified: Secondary | ICD-10-CM

## 2021-02-11 NOTE — Telephone Encounter (Signed)
Received note from pharmacy that they are changing manufacturers for levothyroxine. Patient called and made aware needs labs in 6 weeks. Labs ordered.

## 2021-02-17 ENCOUNTER — Other Ambulatory Visit: Payer: Self-pay

## 2021-02-17 ENCOUNTER — Ambulatory Visit: Payer: Medicare Other | Admitting: Physician Assistant

## 2021-02-17 DIAGNOSIS — M7672 Peroneal tendinitis, left leg: Secondary | ICD-10-CM

## 2021-02-17 NOTE — Telephone Encounter (Signed)
Please advise 

## 2021-02-17 NOTE — Telephone Encounter (Signed)
MRI ordered

## 2021-03-03 ENCOUNTER — Ambulatory Visit: Payer: Medicare Other | Admitting: Physician Assistant

## 2021-03-08 ENCOUNTER — Ambulatory Visit
Admission: RE | Admit: 2021-03-08 | Discharge: 2021-03-08 | Disposition: A | Discharge status: Home / Self Care | Payer: Medicare Other | Source: Ambulatory Visit | Attending: Physician Assistant | Admitting: Physician Assistant

## 2021-03-08 ENCOUNTER — Other Ambulatory Visit: Payer: Self-pay

## 2021-03-08 DIAGNOSIS — M7672 Peroneal tendinitis, left leg: Secondary | ICD-10-CM

## 2021-03-10 ENCOUNTER — Encounter: Payer: Self-pay | Admitting: Physician Assistant

## 2021-03-10 ENCOUNTER — Ambulatory Visit (INDEPENDENT_AMBULATORY_CARE_PROVIDER_SITE_OTHER): Payer: Medicare Other | Admitting: Physician Assistant

## 2021-03-10 ENCOUNTER — Telehealth: Payer: Self-pay | Admitting: Physician Assistant

## 2021-03-10 DIAGNOSIS — M722 Plantar fascial fibromatosis: Secondary | ICD-10-CM | POA: Diagnosis not present

## 2021-03-10 NOTE — Telephone Encounter (Signed)
Pt calling wanting to know if we have received the MRI results. She doesn't want to drive out here if the results arent back. Appt is at 3 today.  CB [phone]

## 2021-03-10 NOTE — Progress Notes (Signed)
HPI: [name] returns today to go over the MRI of her left foot and ankle.  She continues to have pain in the left heel region lateral aspect of the left ankle.  She states her pain is somewhat better.  She really has not been able to tolerate therapy secondary to the pain.  She is in regular shoes today.  Again she did try a cam walker boot and this seemed to aggravate things. MRI is reviewed with the patient.  MRI shows partial tear involving the central band of the plantar fascia proximally.  Mild reactive marrow edema is seen within the plantar aspect of the calcaneus.  Incidental findings ovoid cystic lesion along the lateral aspect of the great toe proximal phalanx abutting the extensor tendon.  Which was suggestive of ganglion cyst.  Moderate osteoarthritis of the first MTP joint. Again she has had pain in the foot for some time saw podiatrist who felt she had plantar fasciitis and gave her cortisone injection.  She has tried stretching.  She has tried a Banker.  She has had no relief.  Physical exam: Left foot no rashes skin lesions ulcerations erythema.  She has tenderness over the proximal central plantar fascia just at the plantar aspect of the calcaneus.  Nontender over the peroneal tendons today.  No tenderness over the Achilles today.  Dorsal pedal pulses intact.  Impression: Left proximal plantar fascia partial tear  Plan: We will place her in a cam walker boot with a silicone heel lift.  She will wear this for the next 4 weeks.  She was also given prescription to return back to physical therapy within the next 2 weeks for isometric exercises and plantar fascia and also modalities.  See her back in 4 weeks see how she is doing overall.  Questions encouraged and answered at length.  She states she is having some gastrointestinal issues and at this point is unable to take NSAIDs therefore elected to place her in a cast without some type of anticoagulation.  She will periodically come  out of the cam walker boot for range of motion of the ankle and foot.  She is weightbearing as tolerated in the cam walker boot.

## 2021-03-10 NOTE — Telephone Encounter (Signed)
Results received. Pt was called and advised

## 2021-03-11 ENCOUNTER — Encounter: Payer: Self-pay | Admitting: Physician Assistant

## 2021-03-11 NOTE — Telephone Encounter (Signed)
Please advise 

## 2021-03-13 ENCOUNTER — Telehealth: Payer: Self-pay

## 2021-03-13 ENCOUNTER — Telehealth: Payer: Self-pay | Admitting: Physician Assistant

## 2021-03-13 NOTE — Telephone Encounter (Signed)
Pt scheduled to see me tomorrow morning

## 2021-03-13 NOTE — Telephone Encounter (Signed)
Workin today or Monday?

## 2021-03-13 NOTE — Telephone Encounter (Signed)
See other message

## 2021-03-13 NOTE — Telephone Encounter (Signed)
Pt called requesting a call back from [facility] states to ask Dr. Ninfa Linden what cast is best. Pt phone number is [phone].

## 2021-03-13 NOTE — Telephone Encounter (Signed)
Is this ok? What cast are you wanting to do?

## 2021-03-13 NOTE — Telephone Encounter (Signed)
Lvm for pt to come in today @ 4pm for her cast

## 2021-03-13 NOTE — Telephone Encounter (Signed)
Patient returned call asked if she can come in tomorrow to have the cast put on? The number to contact patient is [phone]

## 2021-03-14 ENCOUNTER — Ambulatory Visit (INDEPENDENT_AMBULATORY_CARE_PROVIDER_SITE_OTHER): Payer: Medicare Other

## 2021-03-14 DIAGNOSIS — M7672 Peroneal tendinitis, left leg: Secondary | ICD-10-CM | POA: Diagnosis not present

## 2021-03-14 NOTE — Telephone Encounter (Signed)
Cast placed on pt.

## 2021-03-14 NOTE — Progress Notes (Signed)
Pt advised to be on full strength Aspirin 325mg  twice daily and placed in a non weight bearing short leg cast with plantar flexion today. She was advised to call back if she has any problems with this cast and to elevate her leg to reduce swelling. She stated understanding.

## 2021-03-18 ENCOUNTER — Other Ambulatory Visit: Payer: Self-pay | Admitting: Physician Assistant

## 2021-03-18 DIAGNOSIS — N951 Menopausal and female climacteric states: Secondary | ICD-10-CM

## 2021-03-18 DIAGNOSIS — Z780 Asymptomatic menopausal state: Secondary | ICD-10-CM

## 2021-03-25 ENCOUNTER — Other Ambulatory Visit (HOSPITAL_COMMUNITY): Payer: Self-pay | Admitting: Physician Assistant

## 2021-03-25 ENCOUNTER — Other Ambulatory Visit: Payer: Self-pay

## 2021-03-25 ENCOUNTER — Ambulatory Visit (INDEPENDENT_AMBULATORY_CARE_PROVIDER_SITE_OTHER): Payer: Medicare Other

## 2021-03-25 ENCOUNTER — Telehealth: Payer: Self-pay | Admitting: Radiology

## 2021-03-25 ENCOUNTER — Encounter (HOSPITAL_COMMUNITY): Payer: Medicare Other

## 2021-03-25 ENCOUNTER — Telehealth: Payer: Self-pay | Admitting: Physician Assistant

## 2021-03-25 ENCOUNTER — Ambulatory Visit (INDEPENDENT_AMBULATORY_CARE_PROVIDER_SITE_OTHER): Payer: Medicare Other | Admitting: Physician Assistant

## 2021-03-25 ENCOUNTER — Telehealth: Payer: Self-pay

## 2021-03-25 ENCOUNTER — Encounter: Payer: Self-pay | Admitting: Physician Assistant

## 2021-03-25 ENCOUNTER — Ambulatory Visit: Payer: Self-pay

## 2021-03-25 DIAGNOSIS — M79662 Pain in left lower leg: Secondary | ICD-10-CM | POA: Diagnosis not present

## 2021-03-25 DIAGNOSIS — M25562 Pain in left knee: Secondary | ICD-10-CM

## 2021-03-25 DIAGNOSIS — M7989 Other specified soft tissue disorders: Secondary | ICD-10-CM | POA: Diagnosis not present

## 2021-03-25 MED ORDER — LIDOCAINE HCL 1 % IJ SOLN
3.0000 mL | INTRAMUSCULAR | Status: AC | PRN
  Administered 2021-03-25: 18:00:00 3 mL

## 2021-03-25 MED ORDER — METHYLPREDNISOLONE ACETATE 40 MG/ML IJ SUSP
40.0000 mg | INTRAMUSCULAR | Status: AC | PRN
Start: 2021-03-25 — End: 2021-03-25
  Administered 2021-03-25: 18:00:00 40 mg via INTRA_ARTICULAR

## 2021-03-25 MED ORDER — RIVAROXABAN (XARELTO) VTE STARTER PACK (15 & 20 MG): ORAL_TABLET | ORAL | 0 refills | Status: DC

## 2021-03-25 NOTE — Telephone Encounter (Signed)
Calf. Pt may also WBAT. I called and informed the pt. She stated understanding and wouldn't touch the area and would elevate the leg.

## 2021-03-25 NOTE — Telephone Encounter (Signed)
I worked pt in to see gil today @ 1pm. Thank you

## 2021-03-25 NOTE — Telephone Encounter (Signed)
Called pt. She will come in today @ 1pm to see gil

## 2021-03-25 NOTE — Telephone Encounter (Signed)
Patient called triage line this morning requesting appointment to remove cast. She states that she has fallen multiple times with the cast on, has now pulled something in behind her knee which is causing swelling, and does not feel that she can handle the cast. She is now not able to use the knee scooter due to knee pain.   Please advise. OK to remove cast and make appt?  CB [phone]

## 2021-03-25 NOTE — Telephone Encounter (Signed)
Per Artis Delay pt is positive for DVT and he sent in a xarelto starter pack. He also stated pt needs to elevate the leg and no massaging the

## 2021-03-25 NOTE — Progress Notes (Addendum)
Addendum: Vascular lab called with an addendum on patient's Doppler infection does have an occlusive thrombus in the posterior tibial vein.  Patient was called he was made aware of this.  Xarelto starter pack was sent in.  Patient was made aware that she needs to start this medication today. Office Visit Note   Patient: Joy Golden           Date of Birth: [birth year]           MRN: [id number] Visit Date: 03/25/2021              Requested by: Donella Stade, PA-C [address] [place],  [place] [address] PCP: Donella Stade, PA-C   Assessment & Plan: Visit Diagnoses:  1. Pain of left calf   2. Acute pain of left knee     Plan: See her back in just 2 weeks to see how she is doing status post injection in the knee.  Given her significant popliteal discomfort and the fact that she states is having waking pain due to the calf pain we ordered an ultrasound.  This was negative for DVT.  Patient was informed.  Initially the ultrasound tech called telling me that there was a  clot and patient was initially called and told she had a clot.  I read the actual Doppler report and found that she did not have a clot she . Patient did not pick up the Xarelto and therefore it was discontinued.  We will see her back in 2 weeks to see how her knee is doing overall.  She continues to have significant pain in the knee and decreased range of motion would recommend MRI to evaluate knee for meniscal tear.  She did have a popliteal cyst that was seen on the Doppler and I did discuss this with the patient.  Questions were encouraged and answered at length   Follow-Up Instructions: Return in about 2 weeks (around 04/08/2021).   Orders:  Orders Placed This Encounter  Procedures   Large Joint Inj: L knee   XR Knee 1-2 Views Left       Procedures: Large Joint Inj: L knee on 03/25/2021 5:30 PM Indications: pain Details: 22 G 1.5 in needle, anterolateral approach  Arthrogram:  No  Medications: 3 mL lidocaine 1 %; 40 mg methylPREDNISolone acetate 40 MG/ML Outcome: tolerated well, no immediate complications Procedure, treatment alternatives, risks and benefits explained, specific risks discussed. Consent was given by the patient. Immediately prior to procedure a time out was called to verify the correct patient, procedure, equipment, support staff and site/side marked as required. Patient was prepped and draped in the usual sterile fashion.      Clinical Data: No additional findings.   Subjective: Chief Complaint  Patient presents with   Left Knee - Pain   Left Ankle - Pain, Follow-up    HPI [name] comes in today for follow-up of her left heel pain.  She is been in a short leg cast on full-strength aspirin twice daily since 03/14/2021.  She states she started developing knee pain in pain in her calf 3 days after being in the short leg cast.  She has had no shortness of breath no fevers chills.  She has decreased range of motion of the left knee. Review of Systems See HPI otherwise negative  Objective: Vital Signs: There were no vitals taken for this visit.  Physical Exam General well-developed well-nourished female no acute distress  mood affect appropriate. Respirations unlabored. Ortho Exam Left heel she is nontender over the posterior plantar fascia.  Nontender over the Achilles tendon on the left nontender over the peroneal tendons.  Dorsal pedal pulse 2+.  There is no rashes skin lesions ulcerations or impending ulcers left lower extremity.  Left calf supple.  She has exquisite tenderness left popliteal region.  She has tenderness along the medial joint line.  No abnormal warmth erythema or effusion no instability valgus varus stressing.  Forced flexion causes pain. Specialty Comments:  No specialty comments available.  Imaging: US Venous Img Lower Unilateral Left (DVT)  Result Date: 03/25/2021 CLINICAL DATA:  Left leg swelling EXAM: Left LOWER  EXTREMITY VENOUS DOPPLER ULTRASOUND TECHNIQUE: Gray-scale sonography with compression, as well as color and duplex ultrasound, were performed to evaluate the deep venous system(s) from the level of the common femoral vein through the popliteal and proximal calf veins. COMPARISON:  None. FINDINGS: VENOUS Normal compressibility of the common femoral, superficial femoral, and popliteal veins, as well as the visualized calf veins. Visualized portions of profunda femoral vein and great saphenous vein unremarkable. No filling defects to suggest DVT on grayscale or color Doppler imaging. Doppler waveforms show normal direction of venous flow, normal respiratory plasticity and response to augmentation. Limited views of the contralateral common femoral vein are unremarkable. OTHER 5.1 x 1.3 cm popliteal fossa cyst. Limitations: none IMPRESSION: No DVT identified.  Popliteal cyst. Electronically Signed   By: Ofilia Neas M.D.   On: 03/25/2021 16:41   XR Knee 1-2 Views Left  Result Date: 03/25/2021 Left knee 3 views: Knee is well located.  Chondrocalcinosis noted.  Otherwise knee is well-preserved.  No bony abnormalities otherwise.    PMFS History: Patient Active Problem List   Diagnosis Date Noted   LLQ abdominal pain 05/29/2020   Diverticulitis 03/19/2020   Atherosclerosis of aorta ([facility]) 03/19/2020   Positive colorectal cancer screening using Cologuard test 06/30/2019   Family history of Alzheimer's disease 08/17/2018   Menopause 08/16/2018   Stone of salivary gland or duct 07/08/2017   Hashimoto's thyroiditis 04/05/2017   Milia 03/11/2017   Irritable bowel syndrome with constipation 03/11/2017   Right foot pain 08/19/2016   Metatarsalgia of right foot 05/04/2016   Itching 03/02/2016   Hyperlipidemia 02/19/2015   Seborrheic keratosis 07/18/2014   Thyroid activity decreased 04/11/2014   Lumbar radiculopathy 04/11/2014   Left-sided low back pain with left-sided sciatica 04/11/2014   Lumbar  herniated disc 04/11/2014   Past Medical History:  Diagnosis Date   Allergy    Anxiety    Arthritis    Osteoarthritis   Heart murmur    some say yes, some say no   HLD (hyperlipidemia)    pt denies 07/12/19   Hypothyroidism    Spastic colon     Family History  Problem Relation Age of Onset   Alzheimer's disease Mother    Diverticulitis Mother    Tuberculosis Sister    Ovarian cancer Sister    Diabetes Maternal Grandmother    Ovarian cancer Sister    Alzheimer's disease Father    Crohn's disease Son    Irritable bowel syndrome Son    Diabetes Granddaughter     Past Surgical History:  Procedure Laterality Date   ABDOMINAL HYSTERECTOMY  1983   AUGMENTATION MAMMAPLASTY     BACK SURGERY  2002   L4-L5 laminectomy   BREAST ENHANCEMENT SURGERY  2005   CARPAL TUNNEL RELEASE Right 2010   CARPAL TUNNEL WITH CUBITAL  TUNNEL Left    DE QUERVAIN'S RELEASE Right    ELBOW SURGERY Left 2013   GANGLION CYST EXCISION Right 2012   HARDWARE REMOVAL Right    foot   HERNIA REPAIR  1970/1980   4 surgeries- abdominal   PARTIAL HYSTERECTOMY  1976   REMOVAL OF IMPLANT Right 08/21/2016   RT FOOT   REPLACEMENT TOTAL KNEE Right 2009   SACROILIAC JOINT FUSION Left 06/19/2014   Procedure: SACROILIAC JOINT FUSION;  Surgeon: Phylliss Bob, MD;  Location: [facility];  Service: Orthopedics;  Laterality: Left;  Left sided sacroiliac joint fusion   TOE SURGERY Right    great toe horse stepped on it   TONSILLECTOMY  1967   TRIGGER FINGER RELEASE Right    thumb   Social History   Occupational History   Occupation: retired  Tobacco Use   Smoking status: Former    Years: 14.00    Types: Cigarettes    Quit date: 1983    Years since quitting: 39.9   Smokeless tobacco: Never   Tobacco comments:    Quit at age 31.  Vaping Use   Vaping Use: Never used  Substance and Sexual Activity   Alcohol use: Yes    Alcohol/week: 7.0 standard drinks    Types: 7 Glasses of wine per week    Comment: 2 per day    Drug use: No   Sexual activity: Not Currently

## 2021-03-25 NOTE — Telephone Encounter (Signed)
Pt called complaints of falling a few times with cast. Pt states she is experiencing swelling and cast is getting tight. Transferred pt to triage nurse. Please call pt about this matter. Pt phone number is [phone].

## 2021-03-25 NOTE — Telephone Encounter (Signed)
noted 

## 2021-03-26 ENCOUNTER — Other Ambulatory Visit: Payer: Self-pay

## 2021-03-26 ENCOUNTER — Telehealth: Payer: Self-pay

## 2021-03-26 MED ORDER — RIVAROXABAN (XARELTO) VTE STARTER PACK (15 & 20 MG): ORAL_TABLET | ORAL | 0 refills | Status: DC

## 2021-03-26 NOTE — Telephone Encounter (Signed)
Please see

## 2021-03-26 NOTE — Addendum Note (Signed)
Addended by: Elvin So L on: 03/26/2021 11:07 AM   Modules accepted: Orders

## 2021-03-26 NOTE — Telephone Encounter (Signed)
FYI-  Ray with [facility] called wanting to let Joy Golden know about report for DVT.  Stated that Dr. Jimmye Norman did not state if DVT was Negative or Positive, but updated the results in patient's chart.  CB# [phone]  Please advise.  Thank you

## 2021-03-27 NOTE — Telephone Encounter (Signed)
Please advise 

## 2021-03-28 ENCOUNTER — Other Ambulatory Visit: Payer: Self-pay

## 2021-03-28 ENCOUNTER — Telehealth: Payer: Self-pay | Admitting: Radiology

## 2021-03-28 MED ORDER — RIVAROXABAN 20 MG PO TABS: 20.0000 mg | ORAL_TABLET | Freq: Every day | ORAL | 1 refills | Status: DC

## 2021-03-28 NOTE — Telephone Encounter (Signed)
FYI  Rx for Rivaroxaban (Xarelto) was faxed to [phone] with confirmation number [id number] per patient request.

## 2021-03-28 NOTE — Telephone Encounter (Signed)
THANK YOU

## 2021-04-01 ENCOUNTER — Encounter: Payer: Self-pay | Admitting: Physician Assistant

## 2021-04-03 ENCOUNTER — Ambulatory Visit: Payer: Medicare Other | Admitting: Physician Assistant

## 2021-04-10 ENCOUNTER — Ambulatory Visit: Payer: Medicare Other | Admitting: Physician Assistant

## 2021-04-22 ENCOUNTER — Encounter: Payer: Self-pay | Admitting: Physician Assistant

## 2021-04-25 NOTE — Telephone Encounter (Signed)
Could you call to schedule patient.

## 2021-05-05 ENCOUNTER — Telehealth (INDEPENDENT_AMBULATORY_CARE_PROVIDER_SITE_OTHER): Payer: Medicare Other | Admitting: Physician Assistant

## 2021-05-05 ENCOUNTER — Encounter: Payer: Self-pay | Admitting: Physician Assistant

## 2021-05-05 VITALS — Ht 63.0 in | Wt 130.0 lb

## 2021-05-05 DIAGNOSIS — M6788 Other specified disorders of synovium and tendon, other site: Secondary | ICD-10-CM | POA: Insufficient documentation

## 2021-05-05 DIAGNOSIS — R748 Abnormal levels of other serum enzymes: Secondary | ICD-10-CM | POA: Insufficient documentation

## 2021-05-05 DIAGNOSIS — I824Y2 Acute embolism and thrombosis of unspecified deep veins of left proximal lower extremity: Secondary | ICD-10-CM | POA: Insufficient documentation

## 2021-05-05 DIAGNOSIS — M722 Plantar fascial fibromatosis: Secondary | ICD-10-CM | POA: Diagnosis not present

## 2021-05-05 DIAGNOSIS — M7122 Synovial cyst of popliteal space [Baker], left knee: Secondary | ICD-10-CM

## 2021-05-05 DIAGNOSIS — I82442 Acute embolism and thrombosis of left tibial vein: Secondary | ICD-10-CM

## 2021-05-05 DIAGNOSIS — M7662 Achilles tendinitis, left leg: Secondary | ICD-10-CM

## 2021-05-05 DIAGNOSIS — E039 Hypothyroidism, unspecified: Secondary | ICD-10-CM

## 2021-05-05 NOTE — Progress Notes (Signed)
..Virtual Visit via Video Note  I connected with Joy Golden on 05/05/21 at  9:30 AM EST by a video enabled telemedicine application and verified that I am speaking with the correct person using two identifiers.  Location: Patient: home Provider: clinic  .Marland KitchenParticipating in visit:  Patient: Joy Golden Provider: Iran Planas PA-C Provider in training: Judithann Sheen PA-S   I discussed the limitations of evaluation and management by telemedicine and the availability of in person appointments. The patient expressed understanding and agreed to proceed.  History of Present Illness: Pt is a 66 yo female who would like to discuss DVT and anticoagulation.   She went to ortho for plantar fasciitis and then developed peroneal tendonitis and then achilles tendonitis. Pt was given a scooter and developed a bakers cyst. Pt was placed in cast and 11 days later developed a left DVT of tibial vein. She is on [place]. She was on estrogen but she has stopped. She continues to have pain behind the left knee and pain in the left heel and ankle. She continues to not be able to bare weight. She is concerned about staying on anti-colagulant due to cost and wants to come off of it as soon as she can.   .. Active Ambulatory Problems    Diagnosis Date Noted   Thyroid activity decreased 04/11/2014   Lumbar radiculopathy 04/11/2014   Left-sided low back pain with left-sided sciatica 04/11/2014   Lumbar herniated disc 04/11/2014   Seborrheic keratosis 07/18/2014   Hyperlipidemia 02/19/2015   Itching 03/02/2016   Metatarsalgia of right foot 05/04/2016   Right foot pain 08/19/2016   Milia 03/11/2017   Irritable bowel syndrome with constipation 03/11/2017   Hashimoto's thyroiditis 04/05/2017   Stone of salivary gland or duct 07/08/2017   Menopause 08/16/2018   Family history of Alzheimer's disease 08/17/2018   Positive colorectal cancer screening using Cologuard test 06/30/2019   Diverticulitis 03/19/2020    Atherosclerosis of aorta ([place]) 03/19/2020   LLQ abdominal pain 05/29/2020   Acute deep vein thrombosis (DVT) of proximal vein of left lower extremity ([place]) 05/05/2021   Elevated liver enzymes 05/05/2021   Plantar fasciitis, left 05/05/2021   Left Achilles tendinitis 05/05/2021   Left peroneal tendinosis 05/05/2021   Baker's cyst of knee, left 05/05/2021   Resolved Ambulatory Problems    Diagnosis Date Noted   Elevated LDL cholesterol level 03/08/2017   Past Medical History:  Diagnosis Date   Allergy    Anxiety    Arthritis    Heart murmur    HLD (hyperlipidemia)    Hypothyroidism    Spastic colon       Observations/Objective: No acute distress Normal breathing Normal mood and appearance  .Marland Kitchen Today's Vitals   05/05/21 0913  Weight: 130 lb (59 kg)  Height: 5\' 3"  (1.6 m)   Body mass index is 23.03 kg/m.    Assessment and Plan: Marland KitchenMarland KitchenLateisha was seen today for follow-up.  Diagnoses and all orders for this visit:  Acute deep vein thrombosis (DVT) of tibial vein of left lower extremity ([place]) -     Ambulatory referral to Orthopedic Surgery -     US Venous Img Lower Unilateral Left (DVT); Future  Elevated liver enzymes -     COMPLETE METABOLIC PANEL WITH GFR  Hypothyroidism, unspecified type -     TSH + free T4  Plantar fasciitis, left -     Ambulatory referral to Orthopedic Surgery  Left peroneal tendinosis -     Ambulatory referral to Orthopedic  Surgery  Left Achilles tendinitis -     Ambulatory referral to Orthopedic Surgery  Baker's cyst of knee, left -     Ambulatory referral to Orthopedic Surgery   Made referral to [facility] for 2nd opinion of her ongoing pain and treatment plan.  Continue on Xarellto for at least 3 months. 2 weeks before 3 months get repeat venous doppler to make sure DVT cleared.  Stay on estrogen. Discussed immobilization, travel as risk factors.   Needs labs to follow up on thyroid and elevated liver enzymes.    Follow  Up Instructions:    I discussed the assessment and treatment plan with the patient. The patient was provided an opportunity to ask questions and all were answered. The patient agreed with the plan and demonstrated an understanding of the instructions.   The patient was advised to call back or seek an in-person evaluation if the symptoms worsen or if the condition fails to improve as anticipated.    Iran Planas, PA-C

## 2021-05-05 NOTE — Progress Notes (Signed)
Pt discovered she has a blood clot in left leg in dec. Within 10 days after cast being put on leg.  Pt was placed on xarelto pt stopped taking medication, due to price states it cheaper in [place],pt also has pain due a baker cyst behind the knee on the left leg.

## 2021-05-25 ENCOUNTER — Other Ambulatory Visit: Payer: Self-pay | Admitting: Physician Assistant

## 2021-05-25 DIAGNOSIS — E063 Autoimmune thyroiditis: Secondary | ICD-10-CM

## 2021-05-27 ENCOUNTER — Ambulatory Visit (INDEPENDENT_AMBULATORY_CARE_PROVIDER_SITE_OTHER): Payer: Medicare Other

## 2021-05-27 ENCOUNTER — Encounter: Payer: Self-pay | Admitting: Physician Assistant

## 2021-05-27 ENCOUNTER — Other Ambulatory Visit: Payer: Self-pay

## 2021-05-27 DIAGNOSIS — I82442 Acute embolism and thrombosis of left tibial vein: Secondary | ICD-10-CM

## 2021-05-27 LAB — COMPLETE METABOLIC PANEL WITH GFR
AG Ratio: 1.7 (calc) (ref 1.0–2.5)
ALT: 23 U/L (ref 6–29)
AST: 23 U/L (ref 10–35)
Albumin: 4.3 g/dL (ref 3.6–5.1)
Alkaline phosphatase (APISO): 60 U/L (ref 37–153)
BUN: 13 mg/dL (ref 7–25)
CO2: 34 mmol/L — ABNORMAL HIGH (ref 20–32)
Calcium: 10.5 mg/dL — ABNORMAL HIGH (ref 8.6–10.4)
Chloride: 100 mmol/L (ref 98–110)
Creat: 0.86 mg/dL (ref 0.50–1.05)
Globulin: 2.5 g/dL (calc) (ref 1.9–3.7)
Glucose, Bld: 76 mg/dL (ref 65–99)
Potassium: 4.3 mmol/L (ref 3.5–5.3)
Sodium: 139 mmol/L (ref 135–146)
Total Bilirubin: 0.7 mg/dL (ref 0.2–1.2)
Total Protein: 6.8 g/dL (ref 6.1–8.1)
eGFR: 74 mL/min/{1.73_m2} (ref >=60)

## 2021-05-27 LAB — TSH+FREE T4: TSH W/REFLEX TO FT4: 2.32 mIU/L (ref 0.40–4.50)

## 2021-05-27 NOTE — Progress Notes (Signed)
Previous DVT resolved.  Smaller bakers cyst Left medial superficial varicosity. Do you have any pain in the medial mid calf?

## 2021-05-28 ENCOUNTER — Encounter: Payer: Self-pay | Admitting: Physician Assistant

## 2021-05-28 DIAGNOSIS — E039 Hypothyroidism, unspecified: Secondary | ICD-10-CM

## 2021-05-28 NOTE — Telephone Encounter (Signed)
Joy Golden - see supplement message.

## 2021-05-28 NOTE — Progress Notes (Signed)
Thyroid looks great. Recheck in 6 months.  Do you need refills?   Liver enzymes normalized.   Calcium up just a hair? Did anything change with supplements? Recheck with PTH with next thyroid follow up.

## 2021-06-08 ENCOUNTER — Encounter: Payer: Self-pay | Admitting: Emergency Medicine

## 2021-06-08 ENCOUNTER — Emergency Department (INDEPENDENT_AMBULATORY_CARE_PROVIDER_SITE_OTHER): Payer: Medicare Other

## 2021-06-08 ENCOUNTER — Other Ambulatory Visit: Payer: Self-pay

## 2021-06-08 ENCOUNTER — Emergency Department (INDEPENDENT_AMBULATORY_CARE_PROVIDER_SITE_OTHER)
Admission: EM | Admit: 2021-06-08 | Discharge: 2021-06-08 | Disposition: A | Discharge status: Home / Self Care | Payer: Medicare Other | Source: Home / Self Care | Attending: Family Medicine | Admitting: Family Medicine

## 2021-06-08 DIAGNOSIS — S99912A Unspecified injury of left ankle, initial encounter: Secondary | ICD-10-CM | POA: Diagnosis not present

## 2021-06-08 DIAGNOSIS — S93492A Sprain of other ligament of left ankle, initial encounter: Secondary | ICD-10-CM

## 2021-06-08 DIAGNOSIS — M79672 Pain in left foot: Secondary | ICD-10-CM

## 2021-06-08 HISTORY — DX: Acute embolism and thrombosis of unspecified deep veins of unspecified lower extremity: I82.409

## 2021-06-08 NOTE — Discharge Instructions (Addendum)
Take Aleve morning and night with food to reduce pain and inflammation ?Use ice for 20 minutes every few hours to reduce swelling, inflammation, and pain ?Stay on the crutches until you can comfortably bear weight on the ankle ?Follow-up with your primary care doctor or sports medicine ? ?

## 2021-06-08 NOTE — ED Provider Notes (Signed)
[facility]    CSN: [id number] Arrival date & time: 06/08/21  1104      History   Chief Complaint Chief Complaint  Patient presents with   Ankle Pain    Left     HPI Joy Golden is a 66 y.o. female.   HPI  Patient had intractable plantar fasciitis.  It was no responding to treatment so she was placed into a cast.  Unfortunately, in the cast she developed a DVT.  The cast came off and the DVT has resolved.  Now, same leg, she twisted her ankle today.  Her left ankle is painful and swollen.  Pain with weightbearing.  She comes in on crutches.  Past Medical History:  Diagnosis Date   Allergy    Anxiety    Arthritis    Osteoarthritis   DVT (deep venous thrombosis) (HCC)    Heart murmur    some say yes, some say no   HLD (hyperlipidemia)    pt denies 07/12/19   Hypothyroidism    Spastic colon     Patient Active Problem List   Diagnosis Date Noted   Acute deep vein thrombosis (DVT) of proximal vein of left lower extremity ([place]) 05/05/2021   Elevated liver enzymes 05/05/2021   Plantar fasciitis, left 05/05/2021   Left Achilles tendinitis 05/05/2021   Left peroneal tendinosis 05/05/2021   Baker's cyst of knee, left 05/05/2021   LLQ abdominal pain 05/29/2020   Diverticulitis 03/19/2020   Atherosclerosis of aorta ([place]) 03/19/2020   Positive colorectal cancer screening using Cologuard test 06/30/2019   Family history of Alzheimer's disease 08/17/2018   Menopause 08/16/2018   Stone of salivary gland or duct 07/08/2017   Hashimoto's thyroiditis 04/05/2017   Milia 03/11/2017   Irritable bowel syndrome with constipation 03/11/2017   Right foot pain 08/19/2016   Metatarsalgia of right foot 05/04/2016   Itching 03/02/2016   Hyperlipidemia 02/19/2015   Seborrheic keratosis 07/18/2014   Thyroid activity decreased 04/11/2014   Lumbar radiculopathy 04/11/2014   Left-sided low back pain with left-sided sciatica 04/11/2014   Lumbar herniated disc 04/11/2014     Past Surgical History:  Procedure Laterality Date   ABDOMINAL HYSTERECTOMY  1983   AUGMENTATION MAMMAPLASTY     BACK SURGERY  2002   L4-L5 laminectomy   BREAST ENHANCEMENT SURGERY  2005   CARPAL TUNNEL RELEASE Right 2010   CARPAL TUNNEL WITH CUBITAL TUNNEL Left    COLON SURGERY     DE QUERVAIN'S RELEASE Right    ELBOW SURGERY Left 2013   GANGLION CYST EXCISION Right 2012   HARDWARE REMOVAL Right    foot   HERNIA REPAIR  1970/1980   4 surgeries- abdominal   PARTIAL HYSTERECTOMY  1976   REMOVAL OF IMPLANT Right 08/21/2016   RT FOOT   REPLACEMENT TOTAL KNEE Right 2009   SACROILIAC JOINT FUSION Left 06/19/2014   Procedure: SACROILIAC JOINT FUSION;  Surgeon: Phylliss Bob, MD;  Location: [facility];  Service: Orthopedics;  Laterality: Left;  Left sided sacroiliac joint fusion   TOE SURGERY Right    great toe horse stepped on it   TONSILLECTOMY  1967   TRIGGER FINGER RELEASE Right    thumb    OB History   No obstetric history on file.      Home Medications    Prior to Admission medications   Medication Sig Start Date End Date Taking? Authorizing Provider  ALPRAZolam Duanne Moron) 0.5 MG tablet Take by mouth. 04/02/20   [provider]  AMBULATORY NON FORMULARY MEDICATION Bacoba Monieri 1000 mg daily    [provider]  b complex vitamins tablet Take 1 tablet by mouth daily. Bio B    [provider]  Cholecalciferol (VITAMIN D3) 10 MCG (400 UNIT) tablet Take by mouth.    [provider]  Citicoline 500 MG CAPS Take by mouth.    [provider]  Coconut Oil 1000 MG CAPS Take by mouth.    [provider]  hydrOXYzine (ATARAX/VISTARIL) 10 MG tablet Take 1 tablet (10 mg total) by mouth 3 (three) times daily as needed. 12/10/20   Breeback, Royetta Car, PA-C  levothyroxine (SYNTHROID) 100 MCG tablet Take 1 tablet (100 mcg total) by mouth 2 (two) times a week. LABS FOR REFILLS 05/26/21   Donella Stade, PA-C  levothyroxine (SYNTHROID) 88  MCG tablet Take 1 tablet (88 mcg total) by mouth daily. NEEDS LABS 05/26/21   Donella Stade, PA-C  Multiple Vitamin (MULTIVITAMIN) tablet Take 1 tablet by mouth daily.    [provider]  Omega-3 Fatty Acids (OMEGA-3 CF PO) Take by mouth.    [provider]  Probiotic Product (PROBIOTIC PO) Take by mouth. [place]reng[place]    [provider]  rosuvastatin (CRESTOR) 10 MG tablet Take 1 tablet (10 mg total) by mouth daily. 07/09/20   Breeback, Jade L, PA-C  triamcinolone cream (KENALOG) 0.1 % Apply 1 application topically 2 (two) times daily. 11/07/20   Breeback, Jade L, PA-C  Turmeric 500 MG TABS Take by mouth.    [provider]    Family History Family History  Problem Relation Age of Onset   Alzheimer's disease Mother    Diverticulitis Mother    Tuberculosis Sister    Ovarian cancer Sister    Diabetes Maternal Grandmother    Ovarian cancer Sister    Alzheimer's disease Father    Crohn's disease Son    Irritable bowel syndrome Son    Diabetes Granddaughter     Social History Social History   Tobacco Use   Smoking status: Former    Years: 14.00    Types: Cigarettes    Quit date: 1983    Years since quitting: 40.2   Smokeless tobacco: Never   Tobacco comments:    Quit at age 72.  Vaping Use   Vaping Use: Never used  Substance Use Topics   Alcohol use: Yes    Alcohol/week: 5.0 standard drinks    Types: 5 Glasses of wine per week   Drug use: No     Allergies   Mobic [meloxicam]   Review of Systems Review of Systems See HPI  Physical Exam Triage Vital Signs ED Triage Vitals  Enc Vitals Group     BP 06/08/21 1147 129/85     Pulse Rate 06/08/21 1147 (!) 49     Resp 06/08/21 1147 16     Temp 06/08/21 1147 98.8 F (37.1 C)     Temp Source 06/08/21 1147 Oral     SpO2 06/08/21 1147 99 %     Weight 06/08/21 1150 133 lb (60.3 kg)     Height 06/08/21 1150 '5\' 3"'$  (1.6 m)     Head Circumference --      Peak Flow --      Pain Score --       Pain Loc --      Pain Edu? --      Excl. in [place]? --    No data found.  Updated Vital Signs BP 129/85 (BP Location: Right Arm)    Pulse (!) 49 Comment: HR runs in the 40's per pt   Temp 98.8 F (37.1 C) (Oral)    Resp 16    Ht '5\' 3"'$  (1.6 m)    Wt 60.3 kg    SpO2 99%    BMI 23.56 kg/m      Physical Exam Constitutional:      General: She is not in acute distress.    Appearance: Normal appearance. She is well-developed.  HENT:     Head: Normocephalic and atraumatic.  Eyes:     Conjunctiva/sclera: Conjunctivae normal.     Pupils: Pupils are equal, round, and reactive to light.  Cardiovascular:     Rate and Rhythm: Normal rate.  Pulmonary:     Effort: Pulmonary effort is normal. No respiratory distress.  Abdominal:     General: There is no distension.     Palpations: Abdomen is soft.  Musculoskeletal:        General: Swelling, tenderness and signs of injury present. No deformity. Normal range of motion.     Cervical back: Normal range of motion.     Comments: Left ankle has significant soft tissue swelling around the lateral malleolus.  There is tenderness of the ATFL.  No instability.  Good range of motion.  No calf tenderness.  No edema  Skin:    General: Skin is warm and dry.  Neurological:     Mental Status: She is alert.     UC Treatments / Results  Labs (all labs ordered are listed, but only abnormal results are displayed) Labs Reviewed - No data to display  EKG   Radiology DG Ankle Complete Left  Result Date: 06/08/2021 CLINICAL DATA:  rolled left ankle this morning -pain to left ankle AND foot - swelling and bruising noted - unable to bear weight on left foot EXAM: LEFT ANKLE COMPLETE - 3+ VIEW; LEFT FOOT - COMPLETE 3+ VIEW COMPARISON:  November 05, 2020. FINDINGS: No acute fracture or dislocation. Moderate degenerative changes of the first MTP. No area of erosion or osseous destruction. No unexpected radiopaque foreign body. Soft tissues are unremarkable. IMPRESSION:  No acute fracture or dislocation. Electronically Signed   By: Valentino Saxon M.D.   On: 06/08/2021 12:28   DG Foot Complete Left  Result Date: 06/08/2021 CLINICAL DATA:  rolled left ankle this morning -pain to left ankle AND foot - swelling and bruising noted - unable to bear weight on left foot EXAM: LEFT ANKLE COMPLETE - 3+ VIEW; LEFT FOOT - COMPLETE 3+ VIEW COMPARISON:  November 05, 2020. FINDINGS: No acute fracture or dislocation. Moderate degenerative changes of the first MTP. No area of erosion or osseous destruction. No unexpected radiopaque foreign body. Soft tissues are unremarkable. IMPRESSION: No acute fracture or dislocation. Electronically Signed   By: Valentino Saxon M.D.   On: 06/08/2021 12:28    Procedures Procedures (including critical care time)  Medications Ordered in UC Medications - No data to display  Initial Impression / Assessment and Plan / UC Course  I have reviewed the triage vital signs and the nursing notes.  Pertinent labs & imaging results that were available during my care of the patient were reviewed by me and considered in my medical decision making (see chart for details).     DiscussedAnkle sprain Because of her recent DVT and afraid to put her into much of a brace or immobilization.  I do want her to  move her ankle pump at the calf muscle.  This is demonstrated to her.  Follow-up with primary care Final Clinical Impressions(s) / UC Diagnoses   Final diagnoses:  Sprain of anterior talofibular ligament of left ankle, initial encounter     Discharge Instructions      Take Aleve morning and night with food to reduce pain and inflammation Use ice for 20 minutes every few hours to reduce swelling, inflammation, and pain Stay on the crutches until you can comfortably bear weight on the ankle Follow-up with your primary care doctor or sports medicine      ED Prescriptions   None    PDMP not reviewed this encounter.   Raylene Everts,  MD 06/08/21 1501

## 2021-06-08 NOTE — ED Triage Notes (Addendum)
Left ankle pain after rolling left ankle at 0730 ?Aleve  600 mg at 0815 ?Tripped over a cow's hoof at home inside the house ?Has her own crutches ?PT had a DVT in same leg recently  ? ? ?

## 2021-06-16 ENCOUNTER — Other Ambulatory Visit: Payer: Self-pay

## 2021-07-07 ENCOUNTER — Other Ambulatory Visit: Payer: Self-pay | Admitting: Physician Assistant

## 2021-07-07 DIAGNOSIS — Z780 Asymptomatic menopausal state: Secondary | ICD-10-CM

## 2021-07-07 DIAGNOSIS — Z13820 Encounter for screening for osteoporosis: Secondary | ICD-10-CM

## 2021-07-20 ENCOUNTER — Other Ambulatory Visit: Payer: Self-pay | Admitting: Physician Assistant

## 2021-07-20 DIAGNOSIS — I700 Atherosclerosis of aorta: Secondary | ICD-10-CM

## 2021-07-20 DIAGNOSIS — Z13220 Encounter for screening for lipoid disorders: Secondary | ICD-10-CM

## 2021-07-20 DIAGNOSIS — E785 Hyperlipidemia, unspecified: Secondary | ICD-10-CM

## 2021-07-22 ENCOUNTER — Other Ambulatory Visit: Payer: Self-pay | Admitting: Physician Assistant

## 2021-07-22 DIAGNOSIS — E063 Autoimmune thyroiditis: Secondary | ICD-10-CM

## 2021-07-22 LAB — PTH, INTACT AND CALCIUM
Calcium: 9.8 mg/dL (ref 8.6–10.4)
PTH: 42 pg/mL (ref 16–77)

## 2021-07-22 LAB — TSH: TSH: 1.36 mIU/L (ref 0.40–4.50)

## 2021-07-22 MED ORDER — LEVOTHYROXINE SODIUM 88 MCG PO TABS: 88.0000 ug | ORAL_TABLET | Freq: Every day | ORAL | 3 refills | Status: DC

## 2021-07-22 MED ORDER — LEVOTHYROXINE SODIUM 100 MCG PO TABS: ORAL_TABLET | ORAL | 3 refills | Status: DC

## 2021-07-22 NOTE — Progress Notes (Signed)
TSH looks really good. Continue same dose recheck in 1 year. Refills sent to [facility].

## 2021-07-22 NOTE — Progress Notes (Signed)
PTH and calcium are normal on recheck!

## 2021-08-26 HISTORY — PX: FOOT SURGERY: SHX648

## 2021-08-27 ENCOUNTER — Inpatient Hospital Stay (HOSPITAL_COMMUNITY): Admission: RE | Admit: 2021-08-27 | Payer: Medicare Other | Source: Ambulatory Visit

## 2021-08-27 ENCOUNTER — Other Ambulatory Visit (HOSPITAL_COMMUNITY): Payer: Self-pay | Admitting: Orthopaedic Surgery

## 2021-08-27 DIAGNOSIS — M79605 Pain in left leg: Secondary | ICD-10-CM

## 2021-09-11 ENCOUNTER — Encounter: Payer: Self-pay | Admitting: Physician Assistant

## 2021-09-16 ENCOUNTER — Ambulatory Visit: Payer: Medicare Other | Admitting: Physician Assistant

## 2021-09-29 ENCOUNTER — Other Ambulatory Visit: Payer: Self-pay | Admitting: Physician Assistant

## 2021-10-08 ENCOUNTER — Other Ambulatory Visit: Payer: Self-pay | Admitting: Physician Assistant

## 2021-10-08 DIAGNOSIS — I700 Atherosclerosis of aorta: Secondary | ICD-10-CM

## 2021-10-08 DIAGNOSIS — E785 Hyperlipidemia, unspecified: Secondary | ICD-10-CM

## 2021-10-08 DIAGNOSIS — Z13220 Encounter for screening for lipoid disorders: Secondary | ICD-10-CM

## 2021-10-16 ENCOUNTER — Telehealth: Payer: Self-pay | Admitting: Physician Assistant

## 2021-10-16 NOTE — Telephone Encounter (Signed)
This was just sent to the pharmacy on 10/08/2021, she can call them directly.   [place], [place]  Thanks!

## 2021-10-16 NOTE — Telephone Encounter (Signed)
Pt called.  She is requesting a refill on her rosuvastatin.  Her appointment is July 21.

## 2021-10-24 ENCOUNTER — Encounter: Payer: Self-pay | Admitting: Physician Assistant

## 2021-10-24 ENCOUNTER — Ambulatory Visit (INDEPENDENT_AMBULATORY_CARE_PROVIDER_SITE_OTHER): Payer: Medicare Other | Admitting: Physician Assistant

## 2021-10-24 VITALS — BP 112/72 | HR 51 | Ht 63.0 in | Wt 143.0 lb

## 2021-10-24 DIAGNOSIS — I700 Atherosclerosis of aorta: Secondary | ICD-10-CM

## 2021-10-24 DIAGNOSIS — Z1231 Encounter for screening mammogram for malignant neoplasm of breast: Secondary | ICD-10-CM

## 2021-10-24 DIAGNOSIS — R4589 Other symptoms and signs involving emotional state: Secondary | ICD-10-CM

## 2021-10-24 DIAGNOSIS — Z13220 Encounter for screening for lipoid disorders: Secondary | ICD-10-CM | POA: Diagnosis not present

## 2021-10-24 DIAGNOSIS — E785 Hyperlipidemia, unspecified: Secondary | ICD-10-CM

## 2021-10-24 DIAGNOSIS — E063 Autoimmune thyroiditis: Secondary | ICD-10-CM

## 2021-10-24 MED ORDER — ROSUVASTATIN CALCIUM 10 MG PO TABS: ORAL_TABLET | ORAL | 3 refills | Status: DC

## 2021-10-24 MED ORDER — LIDOCAINE 5 % EX PTCH: 1.0000 | MEDICATED_PATCH | Freq: Two times a day (BID) | CUTANEOUS | 5 refills | Status: DC

## 2021-10-24 NOTE — Patient Instructions (Signed)
Encouraged to get shingles vaccine.

## 2021-10-25 NOTE — Progress Notes (Signed)
Established Patient Office Visit  Subjective   Patient ID: Joy Golden, female    DOB: 1955/06/20  Age: 66 y.o. MRN: [id number]  Chief Complaint  Patient presents with   Follow-up    HPI Pt is a 66 yo female with HLD and hypothyroidism with depressed mood and anxiety who needs refills.   Pt has been having a lot of problems with her left foot. She just had surgery and still on crutches. So far she is still in pain and concerned about the progress.   She is not being very active and she is not eating a healthy diet.   .. Active Ambulatory Problems    Diagnosis Date Noted   Thyroid activity decreased 04/11/2014   Lumbar radiculopathy 04/11/2014   Left-sided low back pain with left-sided sciatica 04/11/2014   Lumbar herniated disc 04/11/2014   Seborrheic keratosis 07/18/2014   Hyperlipidemia 02/19/2015   Itching 03/02/2016   Metatarsalgia of right foot 05/04/2016   Right foot pain 08/19/2016   Milia 03/11/2017   Irritable bowel syndrome with constipation 03/11/2017   Hashimoto's thyroiditis 04/05/2017   Stone of salivary gland or duct 07/08/2017   Menopause 08/16/2018   Family history of Alzheimer's disease 08/17/2018   Positive colorectal cancer screening using Cologuard test 06/30/2019   Diverticulitis 03/19/2020   Atherosclerosis of aorta ([place]) 03/19/2020   LLQ abdominal pain 05/29/2020   Acute deep vein thrombosis (DVT) of proximal vein of left lower extremity ([place]) 05/05/2021   Elevated liver enzymes 05/05/2021   Plantar fasciitis, left 05/05/2021   Left Achilles tendinitis 05/05/2021   Left peroneal tendinosis 05/05/2021   Baker's cyst of knee, left 05/05/2021   Resolved Ambulatory Problems    Diagnosis Date Noted   Elevated LDL cholesterol level 03/08/2017   Past Medical History:  Diagnosis Date   Allergy    Anxiety    Arthritis    DVT (deep venous thrombosis) (HCC)    Heart murmur    HLD (hyperlipidemia)    Hypothyroidism    Spastic colon       Review of Systems  All other systems reviewed and are negative.     Objective:     BP 112/72   Pulse (!) 51   Ht '5\' 3"'$  (1.6 m)   Wt 143 lb (64.9 kg)   SpO2 100%   BMI 25.33 kg/m  BP Readings from Last 3 Encounters:  10/24/21 112/72  06/08/21 129/85  07/09/20 103/65   Wt Readings from Last 3 Encounters:  10/24/21 143 lb (64.9 kg)  06/08/21 133 lb (60.3 kg)  05/05/21 130 lb (59 kg)      Physical Exam Constitutional:      Appearance: Normal appearance.  HENT:     Head: Normocephalic.  Cardiovascular:     Rate and Rhythm: Normal rate and regular rhythm.     Pulses: Normal pulses.     Heart sounds: Normal heart sounds.  Pulmonary:     Effort: Pulmonary effort is normal.     Breath sounds: Normal breath sounds.  Musculoskeletal:     Right lower leg: No edema.     Left lower leg: No edema.  Neurological:     General: No focal deficit present.     Mental Status: She is alert and oriented to person, place, and time.  Psychiatric:        Mood and Affect: Mood normal.         Assessment & Plan:  Marland KitchenMarland KitchenMendi was seen today for follow-up.  Diagnoses and all orders for this visit:  Hyperlipidemia, unspecified hyperlipidemia type -     Lipid Panel w/reflex Direct LDL -     rosuvastatin (CRESTOR) 10 MG tablet; TAKE 1 TABLET BY MOUTH ONCE DAILY .  Encounter for screening mammogram for malignant neoplasm of breast -     MM 3D SCREEN BREAST BILATERAL  Hashimoto's thyroiditis -     Thyroid Panel With TSH  Lipid screening -     rosuvastatin (CRESTOR) 10 MG tablet; TAKE 1 TABLET BY MOUTH ONCE DAILY .  Atherosclerosis of aorta (HCC) -     rosuvastatin (CRESTOR) 10 MG tablet; TAKE 1 TABLET BY MOUTH ONCE DAILY .  Depressed mood  Other orders -     lidocaine (LIDODERM) 5 %; Place 1 patch onto the skin every 12 (twelve) hours. Remove & Discard patch within 12 hours or as directed by MD   Will hold on labs until we need to check thyroid again. Refilled  crestor.  Needs mammogram.    Return in about 6 months (around 04/26/2022).    Iran Planas, PA-C

## 2021-10-28 ENCOUNTER — Ambulatory Visit: Payer: Medicare Other

## 2021-10-29 ENCOUNTER — Telehealth: Payer: Self-pay

## 2021-10-29 ENCOUNTER — Other Ambulatory Visit: Payer: Self-pay | Admitting: Radiology

## 2021-10-29 NOTE — Telephone Encounter (Addendum)
Initiated Prior authorization CKI:[id number] 5% patches Via: Covermymeds Case/Key:[id number] Status: denied  as of 10/29/21 Reason:This drug used for OTHER SPECIFIED D/O SYNOVIUM TENDON OTHER SITE is not an approved use.  Medicare Part D rules states the drug must be used for a "medically-accepted indication". A "medicallyaccepted indication" means a use that is approved by the Food and Drug Administration (FDA), or a use  supported by specific resources. These are the [facility] Formulary Service Drug Information and  [place]. Therefore, this drug cannot be covered under your Medicare Part D benefit. Notified Pt via: Mychart

## 2021-11-26 ENCOUNTER — Encounter: Payer: Self-pay | Admitting: General Practice

## 2021-11-27 ENCOUNTER — Ambulatory Visit (INDEPENDENT_AMBULATORY_CARE_PROVIDER_SITE_OTHER): Payer: Medicare Other

## 2021-11-27 DIAGNOSIS — Z1231 Encounter for screening mammogram for malignant neoplasm of breast: Secondary | ICD-10-CM | POA: Diagnosis not present

## 2021-11-28 NOTE — Progress Notes (Signed)
Normal mammogram. Follow up in one year.

## 2022-03-05 IMAGING — CT CT ABD-PELV W/ CM
2 of 5 series · 16 of 46 positions shown, 18 images · IV contrast (APPLIED)
Comparison: 03/19/2020

CLINICAL DATA: Left-sided abdominal pain for 1.5 weeks, diarrhea,
constipation

EXAM:
CT ABDOMEN AND PELVIS WITH CONTRAST
TECHNIQUE: Multidetector CT imaging of the abdomen and pelvis was performed
using the standard protocol following bolus administration of
intravenous contrast.
CONTRAST:  100mL OMNIPAQUE IOHEXOL 300 MG/ML  SOLN

[Series 2: axial st · axial · 0.65mm/px · z∈[-427,-92]mm · 13 of 79 slices shown, 15 images]
[im 6/79  soft-tissue]
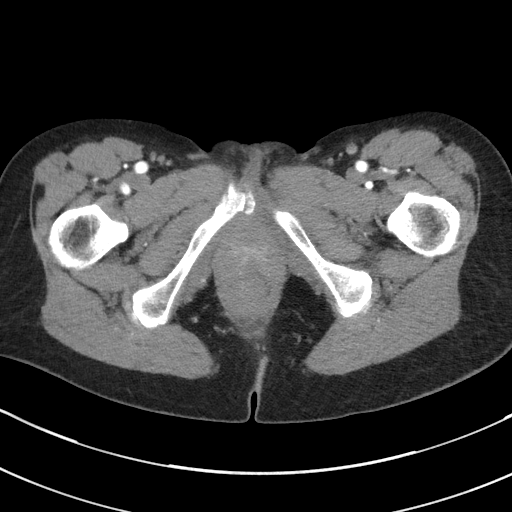
[im 6/79  bone]
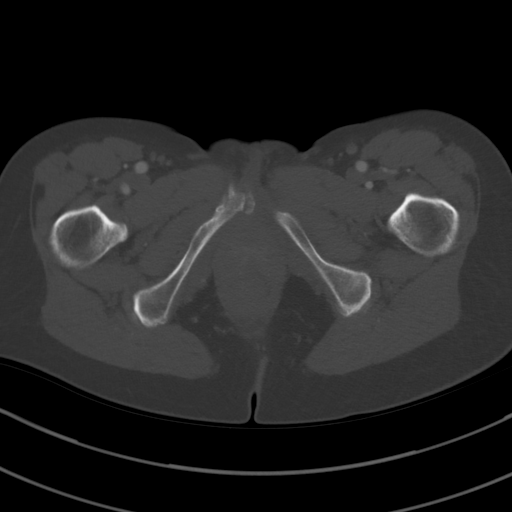
[im 11/79  soft-tissue]
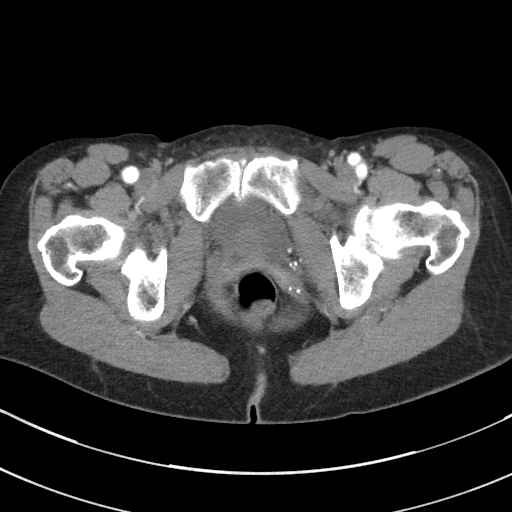
[im 16/79  soft-tissue]
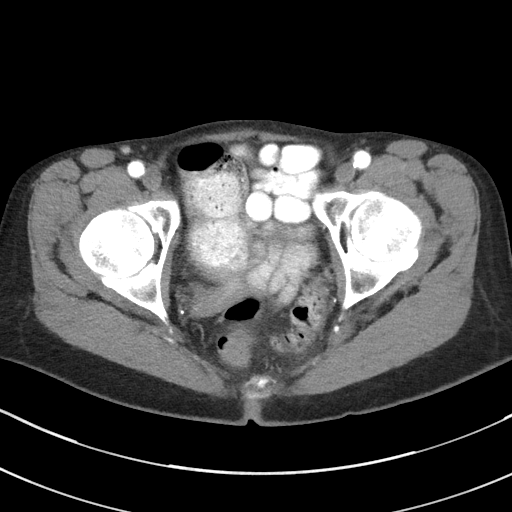
[im 21/79  soft-tissue]
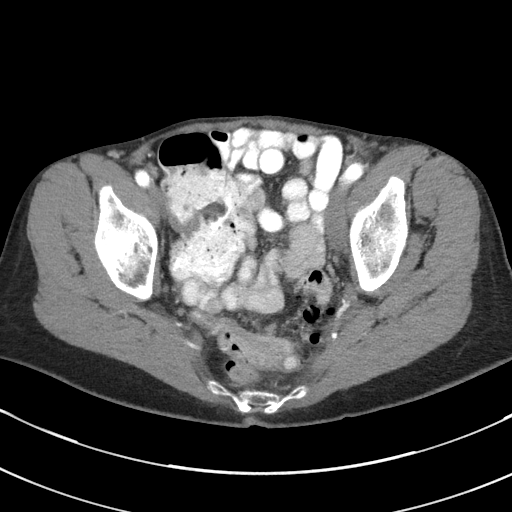
[im 27/79  soft-tissue]
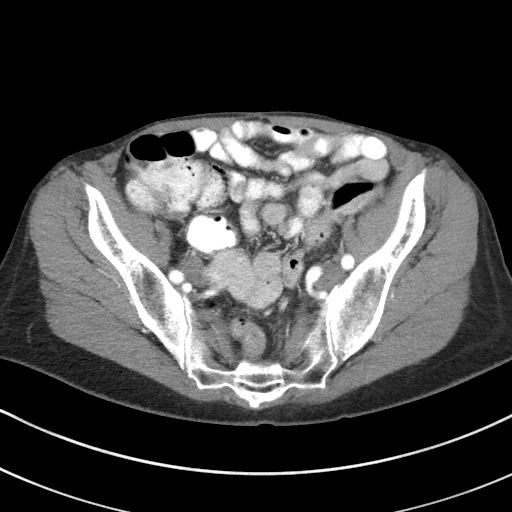
[im 32/79  soft-tissue]
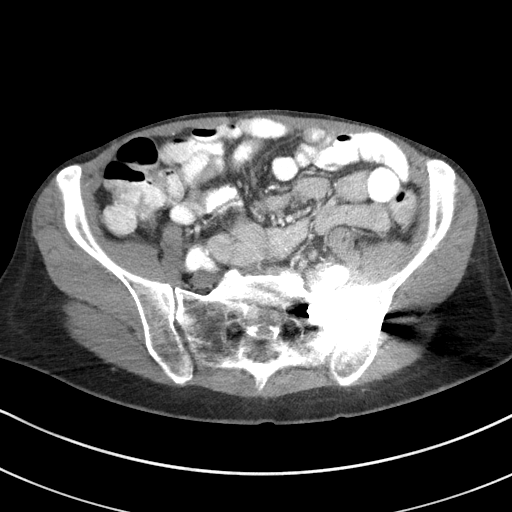
[im 42/79  soft-tissue]
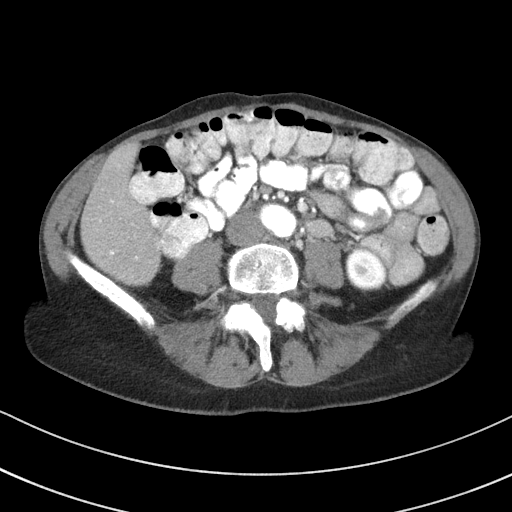
[im 47/79  soft-tissue]
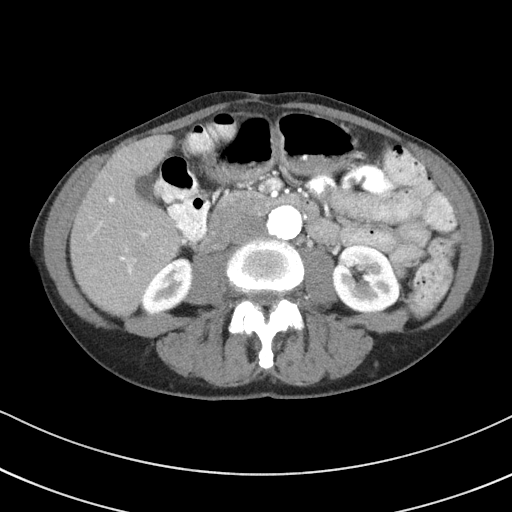
[im 53/79  soft-tissue]
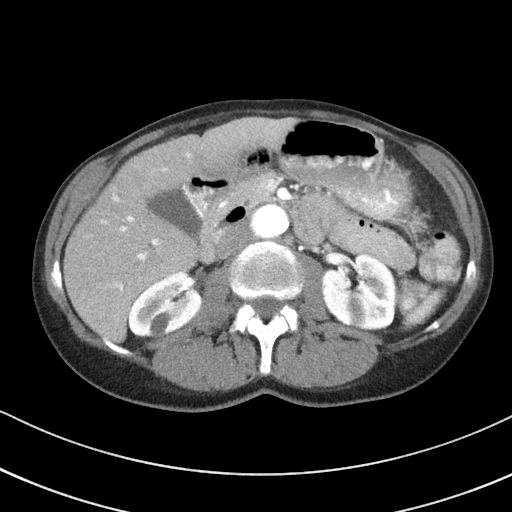
[im 53/79  bone]
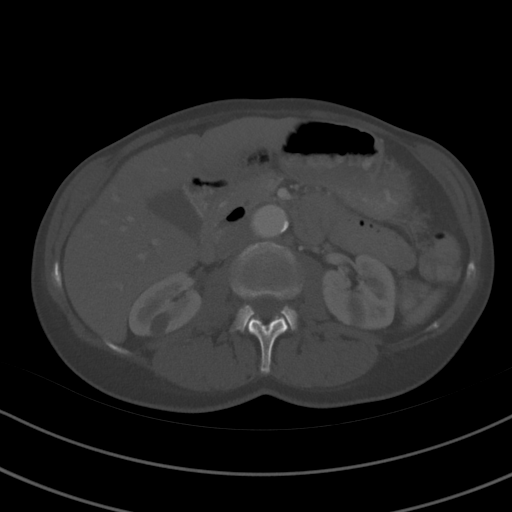
[im 58/79  soft-tissue]
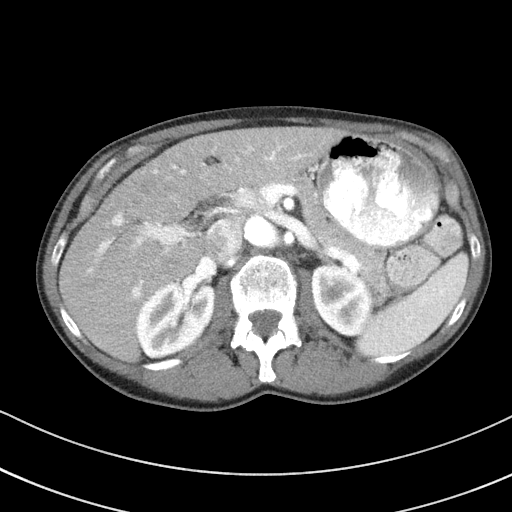
[im 63/79  soft-tissue]
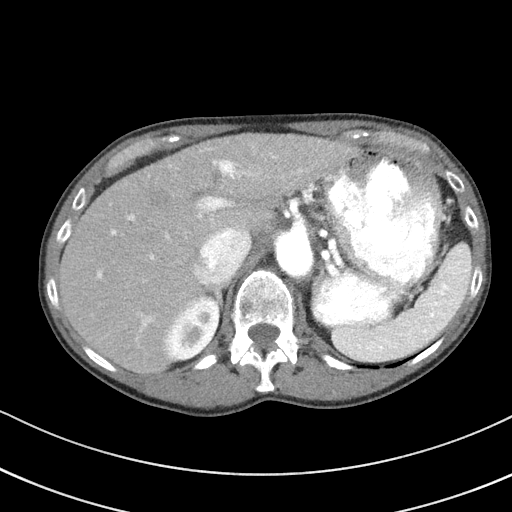
[im 68/79  soft-tissue]
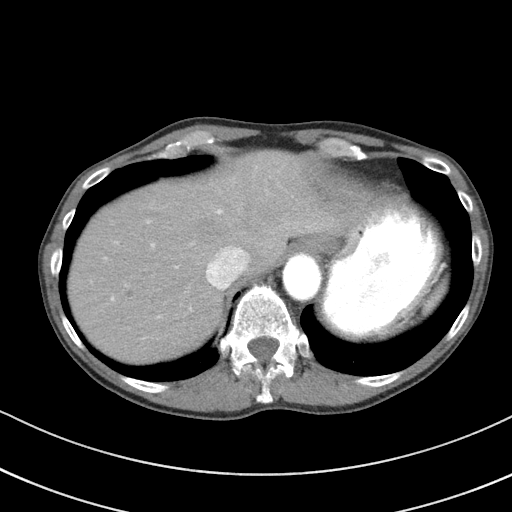
[im 73/79  soft-tissue]
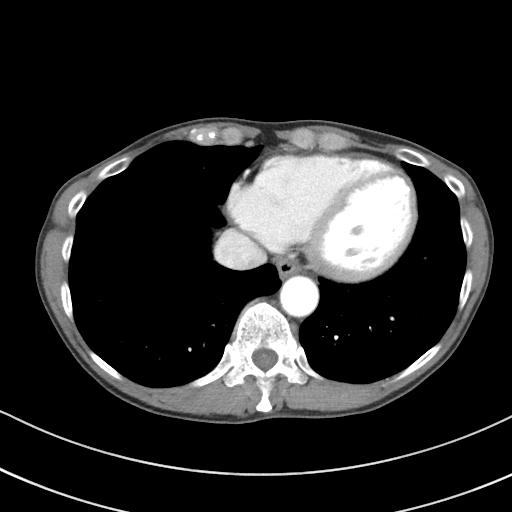

[Series 4: coronal st · coronal · 0.66mm/px · 3 of 78 slices shown]
[im 26/78  soft-tissue]
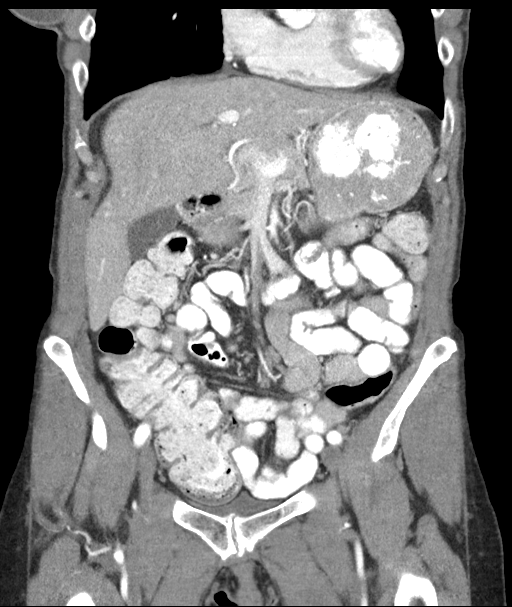
[im 35/78  soft-tissue]
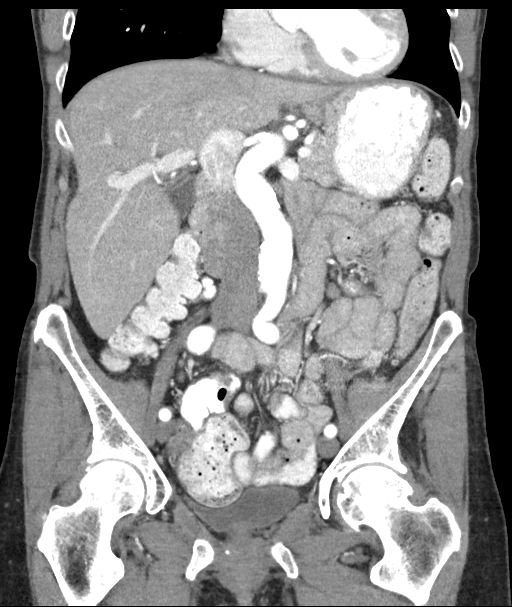
[im 43/78  soft-tissue]
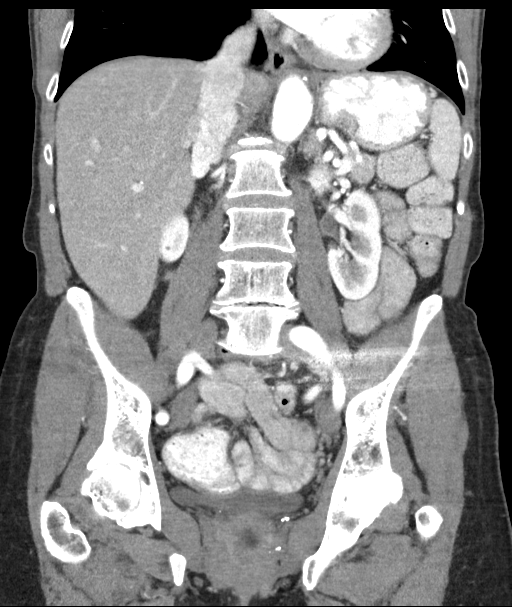

[16 of 46 positions shown; findings below may reference images not displayed]

FINDINGS: Lower chest: No acute pleural or parenchymal lung disease.

Hepatobiliary: Diffuse hepatic steatosis without focal abnormality.
Gallbladder is unremarkable. No biliary dilation.

Pancreas: Unremarkable. No pancreatic ductal dilatation or
surrounding inflammatory changes.

Spleen: Normal in size without focal abnormality.

Adrenals/Urinary Tract: Bilateral renal cortical cysts unchanged. No
urinary tract calculi or obstruction. The adrenals and bladder are
unremarkable.

Stomach/Bowel: No bowel obstruction or ileus. The appendix, if still
present, is not well visualized.

There is diffuse sigmoid diverticulosis. Mild wall thickening of the
distal sigmoid colon is seen, with haziness of the pericolonic fat.
Findings are consistent with acute uncomplicated sigmoid
diverticulitis.

Vascular/Lymphatic: Aortic atherosclerosis. No enlarged abdominal or
pelvic lymph nodes.

Reproductive: Status post hysterectomy. No adnexal masses.

Other: No free fluid or free gas.  No abdominal wall hernia.

Musculoskeletal: Postsurgical changes left sacroiliac joint. There
are no acute or destructive bony lesions. Reconstructed images
demonstrate no additional findings.
IMPRESSION: 1. Persistent findings of acute uncomplicated sigmoid
diverticulitis. Overall, decreased inflammatory changes in
comparison to prior study. No perforation, fluid collection, or
abscess.
2. Hepatic steatosis.
3.  Aortic Atherosclerosis ([id number]).

## 2022-03-07 LAB — LIPID PANEL W/REFLEX DIRECT LDL
Cholesterol: 180 mg/dL (ref <200)
HDL: 77 mg/dL (ref >=50)
LDL Cholesterol (Calc): 83 mg/dL (calc)
Non-HDL Cholesterol (Calc): 103 mg/dL (calc) (ref <130)
Total CHOL/HDL Ratio: 2.3 (calc) (ref <5.0)
Triglycerides: 102 mg/dL (ref <150)

## 2022-03-07 LAB — THYROID PANEL WITH TSH
Free Thyroxine Index: 2.5 (ref 1.4–3.8)
T3 Uptake: 35 % (ref 22–35)
T4, Total: 7.2 ug/dL (ref 5.1–11.9)
TSH: 2.39 mIU/L (ref 0.40–4.50)

## 2022-03-10 ENCOUNTER — Telehealth: Payer: Self-pay | Admitting: General Practice

## 2022-03-10 NOTE — Telephone Encounter (Signed)
Patient declined medicare wellness visit at this time.

## 2022-03-11 NOTE — Progress Notes (Signed)
Your lab work is within acceptable range and there are no concerning findings.   ?

## 2022-05-05 ENCOUNTER — Ambulatory Visit (INDEPENDENT_AMBULATORY_CARE_PROVIDER_SITE_OTHER): Payer: Medicare Other | Admitting: Physician Assistant

## 2022-05-05 ENCOUNTER — Encounter: Payer: Self-pay | Admitting: Physician Assistant

## 2022-05-05 VITALS — BP 121/76 | HR 50 | Ht 63.0 in | Wt 141.0 lb

## 2022-05-05 DIAGNOSIS — M5416 Radiculopathy, lumbar region: Secondary | ICD-10-CM | POA: Diagnosis not present

## 2022-05-05 DIAGNOSIS — M5126 Other intervertebral disc displacement, lumbar region: Secondary | ICD-10-CM | POA: Diagnosis not present

## 2022-05-05 DIAGNOSIS — M545 Low back pain, unspecified: Secondary | ICD-10-CM | POA: Insufficient documentation

## 2022-05-05 DIAGNOSIS — G894 Chronic pain syndrome: Secondary | ICD-10-CM | POA: Insufficient documentation

## 2022-05-05 MED ORDER — TRAMADOL HCL 50 MG PO TABS: ORAL_TABLET | ORAL | 0 refills | Status: DC

## 2022-05-05 NOTE — Patient Instructions (Signed)
Tramadol as needed Massage with Missy Work on core

## 2022-05-05 NOTE — Progress Notes (Signed)
Established Patient Office Visit  Subjective   Patient ID: Joy Golden, female    DOB: March 16, 1956  Age: 67 y.o. MRN: [id number]  Chief Complaint  Patient presents with   Arthritis    HPI Pt is a 67 yo female with lumbar DDD and chronic pain pain who presents to the clinic to discuss pain control. She sees Dr. Lynann Bologna, orthopedic, who has evaluated her. New MRI did not show anything new to treat. Tramadol 1/2 tablet helps when she takes as needed. She would like to have this. She is in PT and going to start [place]. She is getting her strength back from not walking for the last 14 months. No new symptoms.   .. Active Ambulatory Problems    Diagnosis Date Noted   Thyroid activity decreased 04/11/2014   Lumbar radiculopathy 04/11/2014   Left-sided low back pain with left-sided sciatica 04/11/2014   Lumbar herniated disc 04/11/2014   Seborrheic keratosis 07/18/2014   Hyperlipidemia 02/19/2015   Itching 03/02/2016   Metatarsalgia of right foot 05/04/2016   Right foot pain 08/19/2016   [place] 03/11/2017   Irritable bowel syndrome with constipation 03/11/2017   Hashimoto's thyroiditis 04/05/2017   Stone of salivary gland or duct 07/08/2017   Menopause 08/16/2018   Family history of Alzheimer's disease 08/17/2018   Positive colorectal cancer screening using Cologuard test 06/30/2019   Diverticulitis 03/19/2020   Atherosclerosis of aorta ([place]) 03/19/2020   LLQ abdominal pain 05/29/2020   Acute deep vein thrombosis (DVT) of proximal vein of left lower extremity ([place]) 05/05/2021   Elevated liver enzymes 05/05/2021   Plantar fasciitis, left 05/05/2021   Left Achilles tendinitis 05/05/2021   Left peroneal tendinosis 05/05/2021   Baker's cyst of knee, left 05/05/2021   Chronic pain syndrome 05/05/2022   Acute right-sided low back pain without sciatica 05/05/2022   Resolved Ambulatory Problems    Diagnosis Date Noted   Elevated LDL cholesterol level 03/08/2017   Past Medical  History:  Diagnosis Date   Allergy    Anxiety    Arthritis    DVT (deep venous thrombosis) (HCC)    Heart murmur    HLD (hyperlipidemia)    Hypothyroidism    Spastic colon      ROS See HPI.    Objective:     BP 121/76   Pulse (!) 50   Ht '5\' 3"'$  (1.6 m)   Wt 141 lb (64 kg)   SpO2 100%   BMI 24.98 kg/m  BP Readings from Last 3 Encounters:  05/05/22 121/76  10/24/21 112/72  06/08/21 129/85   Wt Readings from Last 3 Encounters:  05/05/22 141 lb (64 kg)  10/24/21 143 lb (64.9 kg)  06/08/21 133 lb (60.3 kg)      Physical Exam Constitutional:      Appearance: Normal appearance.  HENT:     Head: Normocephalic.  Cardiovascular:     Rate and Rhythm: Normal rate.  Pulmonary:     Effort: Pulmonary effort is normal.  Musculoskeletal:     Right lower leg: No edema.     Left lower leg: No edema.     Comments: Tight paraspinal muscles No tenderness over greater trochanter NROM bilateral hips and lower ext 5/5 strength bilateral lower extremity   Neurological:     General: No focal deficit present.     Mental Status: She is alert and oriented to person, place, and time.  Psychiatric:        Mood and Affect: Mood normal.  The 10-year ASCVD risk score (Arnett DK, et al., 2019) is: 6%    Assessment & Plan:  Marland KitchenMarland KitchenAdlee was seen today for arthritis.  Diagnoses and all orders for this visit:  Chronic pain syndrome -     traMADol (ULTRAM) 50 MG tablet; Take one-half to one tablet as needed up to twice a day as needed for moderate to severe back pain.  Lumbar radiculopathy -     traMADol (ULTRAM) 50 MG tablet; Take one-half to one tablet as needed up to twice a day as needed for moderate to severe back pain. -     DRUG MONITORING, PANEL 6 WITH CONFIRMATION, URINE  Lumbar herniated disc -     traMADol (ULTRAM) 50 MG tablet; Take one-half to one tablet as needed up to twice a day as needed for moderate to severe back pain. -     DRUG MONITORING, PANEL 6 WITH  CONFIRMATION, URINE  Acute right-sided low back pain without sciatica -     traMADol (ULTRAM) 50 MG tablet; Take one-half to one tablet as needed up to twice a day as needed for moderate to severe back pain.   Pt does not want to start a daily medication for pain Tramadol as needed 1/2 to 1 tablet has been working well Goal 21 for 3 months. ..PDMP reviewed during this encounter. No concerns UdS done today Pain contract signed Low opoid risk score Consider massages/yoga/core strength training Lidocaine patches as needed Tens unit can also be helpful Follow up in 3-6 months    Iran Planas, PA-C

## 2022-05-07 LAB — DRUG MONITORING, PANEL 6 WITH CONFIRMATION, URINE
6 Acetylmorphine: NEGATIVE ng/mL (ref <10)
Alcohol Metabolites: NEGATIVE ng/mL (ref <500)
Amphetamines: NEGATIVE ng/mL (ref <500)
Barbiturates: NEGATIVE ng/mL (ref <300)
Benzodiazepines: NEGATIVE ng/mL (ref <100)
Cocaine Metabolite: NEGATIVE ng/mL (ref <150)
Creatinine: 16.1 mg/dL — ABNORMAL LOW (ref >=20.0)
Marijuana Metabolite: 59 ng/mL — ABNORMAL HIGH (ref <5)
Marijuana Metabolite: POSITIVE ng/mL — AB (ref <20)
Methadone Metabolite: NEGATIVE ng/mL (ref <100)
Opiates: NEGATIVE ng/mL (ref <100)
Oxidant: NEGATIVE ug/mL (ref <200)
Oxycodone: NEGATIVE ng/mL (ref <100)
Phencyclidine: NEGATIVE ng/mL (ref <25)
Specific Gravity: 1.004 (ref >=1.003)
pH: 7.6 (ref 4.5–9.0)

## 2022-05-07 LAB — DM TEMPLATE

## 2022-05-08 ENCOUNTER — Encounter: Payer: Self-pay | Admitting: Physician Assistant

## 2022-05-08 NOTE — Progress Notes (Signed)
Marijuana was found in the drug screen. To get controlled substances you cannot use. This was just the start of contract but are you ok with those rules. We will screen randomly at visits to confirm.

## 2022-05-27 ENCOUNTER — Other Ambulatory Visit: Payer: Self-pay | Admitting: Neurology

## 2022-05-27 DIAGNOSIS — Z13220 Encounter for screening for lipoid disorders: Secondary | ICD-10-CM

## 2022-05-27 DIAGNOSIS — E785 Hyperlipidemia, unspecified: Secondary | ICD-10-CM

## 2022-05-27 DIAGNOSIS — I700 Atherosclerosis of aorta: Secondary | ICD-10-CM

## 2022-05-27 MED ORDER — ROSUVASTATIN CALCIUM 10 MG PO TABS: ORAL_TABLET | ORAL | 1 refills | Status: DC

## 2022-06-01 ENCOUNTER — Ambulatory Visit (INDEPENDENT_AMBULATORY_CARE_PROVIDER_SITE_OTHER): Payer: Medicare Other | Admitting: Physician Assistant

## 2022-06-01 DIAGNOSIS — Z0000 Encounter for general adult medical examination without abnormal findings: Secondary | ICD-10-CM | POA: Diagnosis not present

## 2022-06-01 DIAGNOSIS — Z780 Asymptomatic menopausal state: Secondary | ICD-10-CM | POA: Diagnosis not present

## 2022-06-01 DIAGNOSIS — Z1231 Encounter for screening mammogram for malignant neoplasm of breast: Secondary | ICD-10-CM

## 2022-06-01 NOTE — Progress Notes (Signed)
MEDICARE ANNUAL WELLNESS VISIT  06/01/2022  Telephone Visit Disclaimer This Medicare AWV was conducted by telephone due to national recommendations for restrictions regarding the COVID-19 Pandemic (e.g. social distancing).  I verified, using two identifiers, that I am speaking with Joy Golden or their authorized healthcare agent. I discussed the limitations, risks, security, and privacy concerns of performing an evaluation and management service by telephone and the potential availability of an in-person appointment in the future. The patient expressed understanding and agreed to proceed.  Location of Patient: Home Location of Provider (nurse):  Provider home  Subjective:    Joy Golden is a 67 y.o. female patient of Alden Hipp, Royetta Car, PA-C who had a [employer] Visit today via telephone. Joy Golden is Retired and lives alone. she has 2 children. she reports that she is socially active and does interact with friends/family regularly. she is moderately physically active and enjoys gardening, tai chi and rescuing animals.  Patient Care Team: Lavada Mesi as PCP - General (Family Medicine)     06/01/2022    9:00 AM 04/09/2014    3:16 PM  Advanced Directives  Does Patient Have a Medical Advance Directive? Yes No  Type of Advance Directive Living will   Does patient want to make changes to medical advance directive? No - Patient declined   Would patient like information on creating a medical advance directive?  No - patient declined information    Hospital Utilization Over the Past 12 Months: # of hospitalizations or ER visits: 0 # of surgeries: 1  Review of Systems    Patient reports that her overall health is better compared to last year.  History obtained from chart review and the patient  Patient Reported Readings (BP, Pulse, CBG, Weight, etc) none  Pain Assessment Pain : No/denies pain     Current Medications & Allergies (verified) Allergies as of  06/01/2022       Reactions   Mobic [meloxicam]    Eye irritation        Medication List        Accurate as of June 01, 2022  9:19 AM. If you have any questions, ask your nurse or doctor.          ALPRAZolam 0.5 MG tablet Commonly known as: XANAX Take by mouth.   AMBULATORY NON FORMULARY MEDICATION Bacoba Monieri 1000 mg daily   b complex vitamins tablet Take 1 tablet by mouth daily. Bio B   Citicoline 500 MG Caps Take by mouth.   Coconut Oil 1000 MG Caps Take by mouth.   hydrOXYzine 10 MG tablet Commonly known as: ATARAX Take 1 tablet (10 mg total) by mouth 3 (three) times daily as needed.   levothyroxine 100 MCG tablet Commonly known as: SYNTHROID Take one tablet before breakfast twice a week. What changed:  how much to take how to take this when to take this additional instructions   levothyroxine 88 MCG tablet Commonly known as: SYNTHROID Take 1 tablet (88 mcg total) by mouth daily before breakfast. What changed: additional instructions   lidocaine 4 % Place 1 patch onto the skin daily.   multivitamin tablet Take 1 tablet by mouth daily.   OMEGA-3 CF PO Take by mouth.   PROBIOTIC PO Take by mouth. Strengtia   rosuvastatin 10 MG tablet Commonly known as: CRESTOR TAKE 1 TABLET BY MOUTH ONCE DAILY .   traMADol 50 MG tablet Commonly known as: ULTRAM Take one-half to one tablet as needed up  to twice a day as needed for moderate to severe back pain.   triamcinolone cream 0.1 % Commonly known as: KENALOG Apply 1 application topically 2 (two) times daily.   Turmeric 500 MG Tabs Take by mouth.   Vitamin D3 10 MCG (400 UNIT) Tabs tablet Generic drug: cholecalciferol Take by mouth.        History (reviewed): Past Medical History:  Diagnosis Date   Allergy    Anxiety    Arthritis    Osteoarthritis   Cataract    DVT (deep venous thrombosis) (HCC)    Heart murmur    some say yes, some say no   HLD (hyperlipidemia)    pt  denies 07/12/19   Hypothyroidism    Spastic colon    Past Surgical History:  Procedure Laterality Date   ABDOMINAL HYSTERECTOMY  1983   AUGMENTATION MAMMAPLASTY     BACK SURGERY  2002   L4-L5 laminectomy   BREAST ENHANCEMENT SURGERY  2005   CARPAL TUNNEL RELEASE Right 2010   CARPAL TUNNEL WITH CUBITAL TUNNEL Left    COLON SURGERY  08/11/2020   DE QUERVAIN'S RELEASE Right    ELBOW SURGERY Left 2013   FOOT SURGERY Left 08/26/2021   GANGLION CYST EXCISION Right 2012   HARDWARE REMOVAL Right    foot   HERNIA REPAIR  1970/1980   4 surgeries- abdominal   JOINT REPLACEMENT  2006   Total Right knee   PARTIAL HYSTERECTOMY  1976   REMOVAL OF IMPLANT Right 08/21/2016   RT FOOT   REPLACEMENT TOTAL KNEE Right 2009   SACROILIAC JOINT FUSION Left 06/19/2014   Procedure: SACROILIAC JOINT FUSION;  Surgeon: Phylliss Bob, MD;  Location: [facility];  Service: Orthopedics;  Laterality: Left;  Left sided sacroiliac joint fusion   SPINE SURGERY  2003   L4-5 Herniated Disk surgery   TOE SURGERY Right    great toe horse stepped on it   TONSILLECTOMY  1967   TRIGGER FINGER RELEASE Right    thumb   TUBAL LIGATION     Family History  Problem Relation Age of Onset   Alzheimer's disease Mother    Diverticulitis Mother    Tuberculosis Sister    Ovarian cancer Sister    Cancer Sister    Diabetes Maternal Grandmother    Ovarian cancer Sister    Cancer Sister    Alzheimer's disease Father    Varicose Veins Father    Crohn's disease Son    Irritable bowel syndrome Son    Diabetes Granddaughter    Social History   Socioeconomic History   Marital status: Divorced    Spouse name: Not on file   Number of children: 2   Years of education: 12   Highest education level: 12th grade  Occupational History   Occupation: retired  Tobacco Use   Smoking status: Former    Years: 14.00    Types: Cigarettes    Quit date: 04/06/1981    Years since quitting: 41.1   Smokeless tobacco: Never   Tobacco  comments:    Misleading question already quit 1983  Vaping Use   Vaping Use: Never used  Substance and Sexual Activity   Alcohol use: Yes    Alcohol/week: 5.0 standard drinks of alcohol    Types: 5 Glasses of wine per week   Drug use: No   Sexual activity: Not Currently  Other Topics Concern   Not on file  Social History Narrative   Lives alone. She has  two children. Her daughter lives in [place]. She enjoys doing tai chi, gardening and rescuing animals.    Social Determinants of Health   Financial Resource Strain: Low Risk  (05/31/2022)   Overall Financial Resource Strain (CARDIA)    Difficulty of Paying Living Expenses: Not hard at all  Food Insecurity: No Food Insecurity (05/31/2022)   Hunger Vital Sign    Worried About Running Out of Food in the Last Year: Never true    Ran Out of Food in the Last Year: Never true  Transportation Needs: No Transportation Needs (05/31/2022)   PRAPARE - Hydrologist (Medical): No    Lack of Transportation (Non-Medical): No  Physical Activity: Sufficiently Active (05/31/2022)   Exercise Vital Sign    Days of Exercise per Week: 5 days    Minutes of Exercise per Session: 40 min  Stress: No Stress Concern Present (05/31/2022)   [place]    Feeling of Stress : Only a little  Social Connections: Moderately Isolated (06/01/2022)   Social Connection and Isolation Panel [NHANES]    Frequency of Communication with Friends and Family: More than three times a week    Frequency of Social Gatherings with Friends and Family: More than three times a week    Attends Religious Services: Never    Marine scientist or Organizations: Yes    Attends Archivist Meetings: More than 4 times per year    Marital Status: Divorced    Activities of Daily Living    05/31/2022    9:43 AM 06/12/2021    2:53 PM  In your present state of health, do you have any  difficulty performing the following activities:  Hearing? 0 0  Vision? 0 0  Difficulty concentrating or making decisions? 0 0  Walking or climbing stairs? 0 0  Dressing or bathing? 0 0  Doing errands, shopping? 0 0  Preparing Food and eating ? N N  Using the Toilet? N N  In the past six months, have you accidently leaked urine? N N  Do you have problems with loss of bowel control? N N  Managing your Medications? N N  Managing your Finances? N N  Housekeeping or managing your Housekeeping? N N    Patient Education/ Literacy How often do you need to have someone help you when you read instructions, pamphlets, or other written materials from your doctor or pharmacy?: 1 - Never What is the last grade level you completed in school?: 12th grade  Exercise Current Exercise Habits: Home exercise routine, Type of exercise: walking;Other - see comments (tai chi), Time (Minutes): 40, Frequency (Times/Week): 5, Weekly Exercise (Minutes/Week): 200, Intensity: Moderate, Exercise limited by: None identified  Diet Patient reports consuming 3 meals a day and 0 snack(s) a day Patient reports that her primary diet is: Regular Patient reports that she does have regular access to food.   Depression Screen    06/01/2022    9:01 AM 05/05/2022   11:27 AM 10/24/2021    9:59 AM 07/09/2020   10:40 AM 06/13/2019   10:44 AM 03/08/2017    8:47 AM  PHQ 2/9 Scores  PHQ - 2 Score 0 0 0 0 0 0  PHQ- 9 Score  0   1      Fall Risk    06/01/2022    9:01 AM 05/31/2022    9:43 AM 05/05/2022   11:28 AM 10/24/2021  9:59 AM 06/12/2021    2:53 PM  Fall Risk   Falls in the past year? 0 0 0 0 0  Number falls in past yr: 0 0 0 0   Injury with Fall? 0 0 0 0   Risk for fall due to : No Fall Risks  No Fall Risks No Fall Risks   Follow up Falls evaluation completed  Falls evaluation completed Falls evaluation completed      Objective:  Joy Golden seemed alert and oriented and she participated appropriately during our  telephone visit.  Blood Pressure Weight BMI  BP Readings from Last 3 Encounters:  05/05/22 121/76  10/24/21 112/72  06/08/21 129/85   Wt Readings from Last 3 Encounters:  05/05/22 141 lb (64 kg)  10/24/21 143 lb (64.9 kg)  06/08/21 133 lb (60.3 kg)   BMI Readings from Last 1 Encounters:  05/05/22 24.98 kg/m    *Unable to obtain current vital signs, weight, and BMI due to telephone visit type  Hearing/Vision  Joy Golden did not seem to have difficulty with hearing/understanding during the telephone conversation Reports that she has had a formal eye exam by an eye care professional within the past year Reports that she has not had a formal hearing evaluation within the past year *Unable to fully assess hearing and vision during telephone visit type  Cognitive Function:    06/01/2022    9:07 AM 06/13/2019   10:00 AM  6CIT Screen  What Year? 0 points 0 points  What month? 0 points 0 points  What time? 0 points 0 points  Count back from 20 0 points 0 points  Months in reverse 0 points 0 points  Repeat phrase 0 points 0 points  Total Score 0 points 0 points   (Normal:0-7, Significant for Dysfunction: >8)  Normal Cognitive Function Screening: Yes   Immunization & Health Maintenance Record Immunization History  Administered Date(s) Administered   COVID-19, mRNA, vaccine(Comirnaty)12 years and older 01/12/2022   PFIZER(Purple Top)SARS-COV-2 Vaccination 05/03/2019, 05/24/2019, 01/18/2020, 07/24/2020   PNEUMOCOCCAL CONJUGATE-20 07/09/2020   Pneumococcal Polysaccharide-23 06/13/2019   Tdap 02/18/2015   Zoster Recombinat (Shingrix) 03/02/2022, 05/29/2022    Health Maintenance  Topic Date Due   COVID-19 Vaccine (6 - 2023-24 season) 06/17/2022 (Originally 03/09/2022)   INFLUENZA VACCINE  07/05/2022 (Originally 11/04/2021)   DEXA SCAN  10/25/2022 (Originally 09/27/2018)   Medicare Annual Wellness (AWV)  06/02/2023   MAMMOGRAM  11/28/2023   DTaP/Tdap/Td (2 - Td or Tdap) 02/17/2025    COLONOSCOPY (Pts 45-73yr Insurance coverage will need to be confirmed)  07/12/2026   Pneumonia Vaccine 67 Years old  Completed   Hepatitis C Screening  Completed   Zoster Vaccines- Shingrix  Completed   HPV VACCINES  Aged Out       Assessment  This is a routine wellness examination for Joy Golden  Health Maintenance: Due or Overdue There are no preventive care reminders to display for this patient.   SClydia Llanodoes not need a referral for Community Assistance: Care Management:   no Social Work:    no Prescription Assistance:  no Nutrition/Diabetes Education:  no   Plan:  Personalized Goals  Goals Addressed               This Visit's Progress     Patient Stated (pt-stated)        Patient stated that she would like to continue to be healthy and stay active.        Personalized Health Maintenance &  Screening Recommendations  Influenza vaccine Screening mammography Bone densitometry screening  Patient declined the influenza vaccine  Lung Cancer Screening Recommended: no (Low Dose CT Chest recommended if Age 57-80 years, 30 pack-year currently smoking OR have quit w/in past 15 years) Hepatitis C Screening recommended: no HIV Screening recommended: no  Advanced Directives: Written information was not prepared per patient's request.  Referrals & Orders Orders Placed This Encounter  Procedures   Mammogram 3D [place]    Follow-up Plan Follow-up with Iran Planas L, PA-C as planned Bone density referral and mammogram referral has been placed.  Medicare wellness visit in one year.  Patient will access AVS on my chart.   I have personally reviewed and noted the following in the patient's chart:   Medical and social history Use of alcohol, tobacco or illicit drugs  Current medications and supplements Functional ability and status Nutritional status Physical activity Advanced directives List of other  physicians Hospitalizations, surgeries, and ER visits in previous 12 months Vitals Screenings to include cognitive, depression, and falls Referrals and appointments  In addition, I have reviewed and discussed with Joy Golden certain preventive protocols, quality metrics, and best practice recommendations. A written personalized care plan for preventive services as well as general preventive health recommendations is available and can be mailed to the patient at her request.      Tinnie Gens, RN BSN  06/01/2022

## 2022-06-01 NOTE — Patient Instructions (Addendum)
Marland [facility] [place] Maintenance Summary and Written Plan of Care  Ms. Lease ,  Thank you for allowing me to perform your Medicare Annual Wellness Visit and for your ongoing commitment to your health.   Health Maintenance & Immunization History Health Maintenance  Topic Date Due   COVID-19 Vaccine (6 - 2023-24 season) 06/17/2022 (Originally 03/09/2022)   INFLUENZA VACCINE  07/05/2022 (Originally 11/04/2021)   DEXA SCAN  10/25/2022 (Originally 09/27/2018)   Medicare Annual Wellness (AWV)  06/02/2023   MAMMOGRAM  11/28/2023   DTaP/Tdap/Td (2 - Td or Tdap) 02/17/2025   COLONOSCOPY (Pts 45-27yr Insurance coverage will need to be confirmed)  07/12/2026   Pneumonia Vaccine 69 Years old  Completed   Hepatitis C Screening  Completed   Zoster Vaccines- Shingrix  Completed   HPV VACCINES  Aged Out   Immunization History  Administered Date(s) Administered   COVID-19, mRNA, vaccine(Comirnaty)12 years and older 01/12/2022   PFIZER(Purple Top)SARS-COV-2 Vaccination 05/03/2019, 05/24/2019, 01/18/2020, 07/24/2020   PNEUMOCOCCAL CONJUGATE-20 07/09/2020   Pneumococcal Polysaccharide-23 06/13/2019   Tdap 02/18/2015   Zoster Recombinat (Shingrix) 03/02/2022, 05/29/2022    These are the patient goals that we discussed:  Goals Addressed               This Visit's Progress     Patient Stated (pt-stated)        Patient stated that she would like to continue to be healthy and stay active.          This is a list of Health Maintenance Items that are overdue or due now: Influenza vaccine Screening mammography Bone densitometry screening  Patient declined the influenza vaccineThere are no preventive care reminders to display for this patient.    Orders/Referrals Placed Today: Orders Placed This Encounter  Procedures   Mammogram 3D SCREEN BREAST BILATERAL    Standing Status:   Future    Standing Expiration Date:   06/02/2023    Scheduling Instructions:     Please  call patient to schedule    Order Specific Question:   Reason for Exam (SYMPTOM  OR DIAGNOSIS REQUIRED)    Answer:   Breast cancer screening    Order Specific Question:   Preferred imaging location?    Answer[facility]    Standing Status:   Future    Standing Expiration Date:   06/02/2023    Scheduling Instructions:     Please call patient to schedule    Order Specific Question:   Reason for exam:    Answer:   post menopausal    Order Specific Question:   Preferred imaging location?    Answer:   [facility]  (Contact our referral department at [phone] you have not spoken with someone about your referral appointment within the next 5 days)    Follow-up Plan Follow-up with BIran PlanasL, PA-C as planned Bone density referral and mammogram referral has been placed.  Medicare wellness visit in one year.  Patient will access AVS on my chart.      Health Maintenance, Female Adopting a healthy lifestyle and getting preventive care are important in promoting health and wellness. Ask your health care provider about: The right schedule for you to have regular tests and exams. Things you can do on your own to prevent diseases and keep yourself healthy. What should I know about diet, weight, and exercise? Eat a healthy diet  Eat a diet that includes plenty of vegetables,  fruits, low-fat dairy products, and lean protein. Do not eat a lot of foods that are high in solid fats, added sugars, or sodium. Maintain a healthy weight Body mass index (BMI) is used to identify weight problems. It estimates body fat based on height and weight. Your health care provider can help determine your BMI and help you achieve or maintain a healthy weight. Get regular exercise Get regular exercise. This is one of the most important things you can do for your health. Most adults should: Exercise for at least 150 minutes each week. The exercise should increase your  heart rate and make you sweat (moderate-intensity exercise). Do strengthening exercises at least twice a week. This is in addition to the moderate-intensity exercise. Spend less time sitting. Even light physical activity can be beneficial. Watch cholesterol and blood lipids Have your blood tested for lipids and cholesterol at 70 years of age, then have this test every 5 years. Have your cholesterol levels checked more often if: Your lipid or cholesterol levels are high. You are older than 69 years of age. You are at high risk for heart disease. What should I know about cancer screening? Depending on your health history and family history, you may need to have cancer screening at various ages. This may include screening for: Breast cancer. Cervical cancer. Colorectal cancer. Skin cancer. Lung cancer. What should I know about heart disease, diabetes, and high blood pressure? Blood pressure and heart disease High blood pressure causes heart disease and increases the risk of stroke. This is more likely to develop in people who have high blood pressure readings or are overweight. Have your blood pressure checked: Every 3-5 years if you are 69-38 years of age. Every year if you are 31 years old or older. Diabetes Have regular diabetes screenings. This checks your fasting blood sugar level. Have the screening done: Once every three years after age 69 if you are at a normal weight and have a low risk for diabetes. More often and at a younger age if you are overweight or have a high risk for diabetes. What should I know about preventing infection? Hepatitis B If you have a higher risk for hepatitis B, you should be screened for this virus. Talk with your health care provider to find out if you are at risk for hepatitis B infection. Hepatitis C Testing is recommended for: Everyone born from [birth year] through 1967. Anyone with known risk factors for hepatitis C. Sexually transmitted infections  (STIs) Get screened for STIs, including gonorrhea and chlamydia, if: You are sexually active and are younger than 69 years of age. You are older than 69 years of age and your health care provider tells you that you are at risk for this type of infection. Your sexual activity has changed since you were last screened, and you are at increased risk for chlamydia or gonorrhea. Ask your health care provider if you are at risk. Ask your health care provider about whether you are at high risk for HIV. Your health care provider may recommend a prescription medicine to help prevent HIV infection. If you choose to take medicine to prevent HIV, you should first get tested for HIV. You should then be tested every 3 months for as long as you are taking the medicine. Pregnancy If you are about to stop having your period (premenopausal) and you may become pregnant, seek counseling before you get pregnant. Take 400 to 800 micrograms (mcg) of folic acid every day if you become  pregnant. Ask for birth control (contraception) if you want to prevent pregnancy. Osteoporosis and menopause Osteoporosis is a disease in which the bones lose minerals and strength with aging. This can result in bone fractures. If you are 52 years old or older, or if you are at risk for osteoporosis and fractures, ask your health care provider if you should: Be screened for bone loss. Take a calcium or vitamin D supplement to lower your risk of fractures. Be given hormone replacement therapy (HRT) to treat symptoms of menopause. Follow these instructions at home: Alcohol use Do not drink alcohol if: Your health care provider tells you not to drink. You are pregnant, may be pregnant, or are planning to become pregnant. If you drink alcohol: Limit how much you have to: 0-1 drink a day. Know how much alcohol is in your drink. In the [place] one drink equals one 12 oz bottle of beer (355 mL), one 5 oz glass of wine (148 mL), or one 1 oz glass  of hard liquor (44 mL). Lifestyle Do not use any products that contain nicotine or tobacco. These products include cigarettes, chewing tobacco, and vaping devices, such as e-cigarettes. If you need help quitting, ask your health care provider. Do not use street drugs. Do not share needles. Ask your health care provider for help if you need support or information about quitting drugs. General instructions Schedule regular health, dental, and eye exams. Stay current with your vaccines. Tell your health care provider if: You often feel depressed. You have ever been abused or do not feel safe at home. Summary Adopting a healthy lifestyle and getting preventive care are important in promoting health and wellness. Follow your health care provider's instructions about healthy diet, exercising, and getting tested or screened for diseases. Follow your health care provider's instructions on monitoring your cholesterol and blood pressure. This information is not intended to replace advice given to you by your health care provider. Make sure you discuss any questions you have with your health care provider. Document Revised: 08/12/2020 Document Reviewed: 08/12/2020 Elsevier Patient Education  [place].

## 2022-06-03 ENCOUNTER — Encounter: Payer: Self-pay | Admitting: Physician Assistant

## 2022-06-04 ENCOUNTER — Ambulatory Visit (INDEPENDENT_AMBULATORY_CARE_PROVIDER_SITE_OTHER): Payer: Medicare Other | Admitting: Family Medicine

## 2022-06-04 ENCOUNTER — Encounter: Payer: Self-pay | Admitting: Family Medicine

## 2022-06-04 VITALS — BP 117/80 | HR 54 | Temp 98.2°F | Ht 63.0 in | Wt 142.0 lb

## 2022-06-04 DIAGNOSIS — J019 Acute sinusitis, unspecified: Secondary | ICD-10-CM

## 2022-06-04 MED ORDER — AZITHROMYCIN 250 MG PO TABS: ORAL_TABLET | ORAL | 0 refills | Status: AC

## 2022-06-04 NOTE — Progress Notes (Signed)
   Acute Office Visit  Subjective:     Patient ID: Joy Golden, female    DOB: [birth year], 67 y.o.   MRN: [id number]  Chief Complaint  Patient presents with   Sinus Problem   Ear Pain   Cough   Headache    Symptoms 8/9 days     HPI Patient is in today for Cough, Headache, sinus pressure and congestion x 8-9 days.  Facial pressure is a little worse on the right side..  Neg COVID test.  FEver early on.   Stomach upset.  She has been using a Nettie pot to help with irrigation she came in today because she actually felt a little worse she feels subjectively like she is running a fever.  ROS      Objective:    BP 117/80 (BP Location: Left Arm, Cuff Size: Normal)   Pulse (!) 54   Temp 98.2 F (36.8 C) (Temporal)   Ht 5' 3"$  (1.6 m)   Wt 142 lb 0.6 oz (64.4 kg)   SpO2 100%   BMI 25.16 kg/m    Physical Exam Constitutional:      Appearance: She is well-developed.  HENT:     Head: Normocephalic and atraumatic.     Right Ear: External ear normal.     Left Ear: External ear normal.     Nose: Nose normal.  Eyes:     Conjunctiva/sclera: Conjunctivae normal.     Pupils: Pupils are equal, round, and reactive to light.  Neck:     Thyroid: No thyromegaly.  Cardiovascular:     Rate and Rhythm: Normal rate and regular rhythm.     Heart sounds: Normal heart sounds.  Pulmonary:     Effort: Pulmonary effort is normal.     Breath sounds: Normal breath sounds. No wheezing.  Musculoskeletal:     Cervical back: Neck supple.  Lymphadenopathy:     Cervical: No cervical adenopathy.  Skin:    General: Skin is warm and dry.  Neurological:     Mental Status: She is alert and oriented to person, place, and time.     No results found for any visits on 06/04/22.      Assessment & Plan:   Problem List Items Addressed This Visit   None Visit Diagnoses     Acute non-recurrent sinusitis, unspecified location    -  Primary   Relevant Medications   azithromycin (ZITHROMAX) 250 MG  tablet       Acute sinusitis with symptoms x 9 days feeling worse today.  Will go ahead and treat with azithromycin.  Okay to continue symptomatic care recommend continue with nasal saline and staying well-hydrated.  Follow-up in 1 week if not improving.  Meds ordered this encounter  Medications   azithromycin (ZITHROMAX) 250 MG tablet    Sig: 2 Ttabs PO on Day 1, then one a day x 4 days.    Dispense:  6 tablet    Refill:  0    No follow-ups on file.  Beatrice Lecher, MD

## 2022-06-07 ENCOUNTER — Encounter: Payer: Self-pay | Admitting: Family Medicine

## 2022-06-08 MED ORDER — METHYLPREDNISOLONE 4 MG PO TBPK: ORAL_TABLET | ORAL | 0 refills | Status: DC

## 2022-09-26 ENCOUNTER — Other Ambulatory Visit: Payer: Self-pay | Admitting: Physician Assistant

## 2022-09-26 DIAGNOSIS — E063 Autoimmune thyroiditis: Secondary | ICD-10-CM

## 2022-10-02 ENCOUNTER — Other Ambulatory Visit: Payer: Self-pay | Admitting: Physician Assistant

## 2022-10-04 ENCOUNTER — Encounter: Payer: Self-pay | Admitting: Physician Assistant

## 2022-10-05 MED ORDER — HYOSCYAMINE SULFATE ER 0.375 MG PO TB12: 0.3750 mg | ORAL_TABLET | Freq: Two times a day (BID) | ORAL | 1 refills | Status: DC | PRN

## 2022-10-05 NOTE — Addendum Note (Signed)
Addended by: Elizabeth Palau on: 10/05/2022 01:04 PM   Modules accepted: Orders

## 2022-10-05 NOTE — Telephone Encounter (Signed)
Pended medication

## 2022-10-05 NOTE — Telephone Encounter (Signed)
Pended rx again -to [facility]

## 2022-10-21 ENCOUNTER — Ambulatory Visit: Payer: BLUE CROSS/BLUE SHIELD

## 2022-10-21 ENCOUNTER — Other Ambulatory Visit: Payer: BLUE CROSS/BLUE SHIELD

## 2022-12-03 ENCOUNTER — Encounter: Payer: Self-pay | Admitting: Physician Assistant

## 2022-12-03 DIAGNOSIS — E063 Autoimmune thyroiditis: Secondary | ICD-10-CM

## 2022-12-05 LAB — THYROID PANEL WITH TSH
Free Thyroxine Index: 2.3 (ref 1.2–4.9)
T3 Uptake Ratio: 28 % (ref 24–39)
T4, Total: 8.2 ug/dL (ref 4.5–12.0)
TSH: 1.22 u[IU]/mL (ref 0.450–4.500)

## 2022-12-07 ENCOUNTER — Other Ambulatory Visit: Payer: Self-pay | Admitting: Physician Assistant

## 2022-12-07 NOTE — Progress Notes (Signed)
Thyroid looks really good.

## 2022-12-09 ENCOUNTER — Other Ambulatory Visit: Payer: Self-pay | Admitting: Physician Assistant

## 2022-12-09 DIAGNOSIS — E785 Hyperlipidemia, unspecified: Secondary | ICD-10-CM

## 2022-12-09 DIAGNOSIS — Z13220 Encounter for screening for lipoid disorders: Secondary | ICD-10-CM

## 2022-12-09 DIAGNOSIS — I700 Atherosclerosis of aorta: Secondary | ICD-10-CM

## 2022-12-12 ENCOUNTER — Encounter: Payer: Self-pay | Admitting: Physician Assistant

## 2022-12-14 ENCOUNTER — Encounter: Payer: Self-pay | Admitting: Physician Assistant

## 2022-12-14 NOTE — Telephone Encounter (Signed)
Yes for chronic pain due to OA and inability to sit or stand for longs period of time.

## 2022-12-16 ENCOUNTER — Other Ambulatory Visit: Payer: BLUE CROSS/BLUE SHIELD

## 2022-12-31 ENCOUNTER — Ambulatory Visit: Payer: BLUE CROSS/BLUE SHIELD

## 2023-01-07 ENCOUNTER — Ambulatory Visit: Payer: BLUE CROSS/BLUE SHIELD

## 2023-01-12 ENCOUNTER — Encounter: Payer: Self-pay | Admitting: Physician Assistant

## 2023-01-12 ENCOUNTER — Ambulatory Visit: Payer: Medicare Other

## 2023-01-12 ENCOUNTER — Ambulatory Visit (INDEPENDENT_AMBULATORY_CARE_PROVIDER_SITE_OTHER): Payer: Medicare Other | Admitting: Physician Assistant

## 2023-01-12 VITALS — BP 112/62 | HR 54 | Ht 63.0 in | Wt 139.0 lb

## 2023-01-12 DIAGNOSIS — Z789 Other specified health status: Secondary | ICD-10-CM

## 2023-01-12 DIAGNOSIS — Z86718 Personal history of other venous thrombosis and embolism: Secondary | ICD-10-CM

## 2023-01-12 DIAGNOSIS — M79662 Pain in left lower leg: Secondary | ICD-10-CM

## 2023-01-12 NOTE — Progress Notes (Unsigned)
Acute Office Visit  Subjective:     Patient ID: Joy Golden, female    DOB: 16-Jul-1955, 67 y.o.   MRN: [id number]  No chief complaint on file.   HPI Patient is in today for left posterior calf pain for the last week and half. Worse with walking. She has a history of DVT and she recently traveled to [place] on plane. She is worried about DVT. Motrin does help the pain. No SOB, CP, fever, chills, tachycardia.   .. Active Ambulatory Problems    Diagnosis Date Noted   Thyroid activity decreased 04/11/2014   Lumbar radiculopathy 04/11/2014   Left-sided low back pain with left-sided sciatica 04/11/2014   Lumbar herniated disc 04/11/2014   Seborrheic keratosis 07/18/2014   Hyperlipidemia 02/19/2015   Itching 03/02/2016   Metatarsalgia of right foot 05/04/2016   Right foot pain 08/19/2016   Milia 03/11/2017   Irritable bowel syndrome with constipation 03/11/2017   Hashimoto's thyroiditis 04/05/2017   Stone of salivary gland or duct 07/08/2017   Menopause 08/16/2018   Family history of Alzheimer's disease 08/17/2018   Positive colorectal cancer screening using Cologuard test 06/30/2019   Diverticulitis 03/19/2020   Atherosclerosis of aorta (HCC) 03/19/2020   LLQ abdominal pain 05/29/2020   Acute deep vein thrombosis (DVT) of proximal vein of left lower extremity (HCC) 05/05/2021   Elevated liver enzymes 05/05/2021   Plantar fasciitis, left 05/05/2021   Left Achilles tendinitis 05/05/2021   Left peroneal tendinosis 05/05/2021   Baker's cyst of knee, left 05/05/2021   Chronic pain syndrome 05/05/2022   Acute right-sided low back pain without sciatica 05/05/2022   Resolved Ambulatory Problems    Diagnosis Date Noted   Elevated LDL cholesterol level 03/08/2017   Past Medical History:  Diagnosis Date   Allergy    Anxiety    Arthritis    Cataract    DVT (deep venous thrombosis) (HCC)    Heart murmur    HLD (hyperlipidemia)    Hypothyroidism    Spastic colon        ROS  See HPI.     Objective:    There were no vitals taken for this visit. BP Readings from Last 3 Encounters:  01/12/23 112/62  06/04/22 117/80  05/05/22 121/76   Wt Readings from Last 3 Encounters:  01/12/23 139 lb (63 kg)  06/04/22 142 lb 0.6 oz (64.4 kg)  05/05/22 141 lb (64 kg)      Physical Exam Constitutional:      Appearance: Normal appearance.  HENT:     Head: Normocephalic.  Cardiovascular:     Rate and Rhythm: Normal rate and regular rhythm.  Pulmonary:     Effort: Pulmonary effort is normal.     Breath sounds: Normal breath sounds.  Musculoskeletal:     Right lower leg: No edema.     Left lower leg: No edema.     Comments: Left calf is 8.6cm Right calf is 8.0cm Tenderness to palpation over posterior left calf with no warmth or redness NROM bilateral lower legs Strength 5/5 lower ext Pain in left calf with plantar and dorsiflexion  Neurological:     Mental Status: She is alert.          Assessment & Plan:  Marland KitchenMarland KitchenSherlie was seen today for medical management of chronic issues.  Diagnoses and all orders for this visit:  Pain of left calf -     [place] Venous Img Lower Unilateral Left (DVT); Future  History of DVT (deep vein  thrombosis) -     [place] Venous Img Lower Unilateral Left (DVT); Future  Recent travel on aircraft -     [place] Venous Img Lower Unilateral Left (DVT); Future   Pt has recent history of travel with past history of DVT Calf size is a little bigger on left than right Left venous doppler stat negative for DVT Discussed likely strain of calf Exercise given with ice and NSAID  Follow up with sports medicine, Dr. Karie Schwalbe as needed.   Joy Gaw, PA-C

## 2023-01-12 NOTE — Telephone Encounter (Signed)
Patient scheduled.

## 2023-01-12 NOTE — Progress Notes (Signed)
GREAT news negative for blood clot! Work on calf exercises for rehab.

## 2023-01-13 NOTE — Patient Instructions (Signed)
Medial Head Gastrocnemius Tear Rehab Ask your health care provider which exercises are safe for you. Do exercises exactly as told by your health care provider and adjust them as directed. It is normal to feel mild stretching, pulling, tightness, or discomfort as you do these exercises. Stop right away if you feel sudden pain or your pain gets worse. Do not begin these exercises until told by your health care provider. Stretching and range-of-motion exercises These exercises warm up your muscles and joints and improve the movement and flexibility of your lower leg. These exercises also help to relieve pain and stiffness. Gastrocnemius stretch This exercise is also called a calf stretch. It stretches the muscles in the back of the lower leg (gastrocnemius). Sit with your left / right leg extended. Loop a belt or towel around the ball of your left / right foot. The ball of your foot is on the walking surface, right under your toes. Hold both ends of the belt or towel. Keep your left / right ankle and foot relaxed and keep your knee straight while you use the belt or towel to pull your foot and ankle toward you. Stop at the first point of resistance. Hold this position for __________ seconds. Repeat __________ times. Complete this exercise __________ times a day. Ankle alphabet  Sit with your left / right leg supported at the lower leg. Do not rest your foot on anything. Make sure your foot has room to move freely. Think of your left / right foot as a paintbrush. Move your foot to trace each letter of the alphabet in the air. Keep your hip and knee still while you trace. Make the letters as large as you can without feeling discomfort. Trace every letter of the alphabet. Repeat __________ times. Complete this exercise __________ times a day. Strengthening exercises These exercises build strength and endurance in your lower leg. Endurance is the ability to use your muscles for a long time, even  after they get tired. Plantar flexion with band, seated  Sit on the floor with your left / right leg extended. Loop a rubber exercise band or tube around the ball of your left / right foot. The ball of your foot is on the walking surface, right under your toes. The band or tube should be slightly tense when your foot is relaxed. If the band or tube slips, you can put on your shoe or put a washcloth between the band and your foot to help it stay in place. While holding both ends of the band or tube, slowly point your toes downward, pushing them away from you (plantar flexion). Hold this position for __________ seconds. Slowly release the tension in the band or tube, controlling smoothly until your foot is back to the starting position. Repeat __________ times. Complete this exercise __________ times a day. Plantar flexion, standing  Stand with your feet shoulder-width apart. Place your hands on a wall or table to steady yourself as needed, but try not to use it for support. Rise up on your toes (plantar flexion). If this exercise is too easy, try these options: Shift your weight toward your left / right leg until you feel challenged. If told by your health care provider, stand on your left / right foot only. Hold this position for __________ seconds. Repeat __________ times. Complete this exercise __________ times a day. Eccentric plantar flexion  Stand on the balls of your feet on the edge of a step. The ball of your foot is  on the walking surface, right under your toes. Do not put your heels on the step. For balance, rest your hands on the wall or on a railing. Try not to lean on it for support. Rise up onto the balls of your feet, using both legs to help. Keeping your heels up, slowly shift all of your weight to your left / right foot and lift your other foot off the step. Slowly lower your left / right heel so it drops below the level of the step. Lowering your heel under tension is  called eccentric plantar flexion. You will feel a slight stretch in your left / right calf. Put your other foot back onto the step before returning to the start position. Repeat __________ times. Complete this exercise __________ times a day. This information is not intended to replace advice given to you by your health care provider. Make sure you discuss any questions you have with your health care provider. Document Revised: 09/17/2020 Document Reviewed: 09/17/2020 Elsevier Patient Education  2024 [employer].

## 2023-02-03 ENCOUNTER — Other Ambulatory Visit: Payer: Self-pay | Admitting: Physician Assistant

## 2023-02-03 ENCOUNTER — Ambulatory Visit: Payer: Medicare Other

## 2023-02-03 DIAGNOSIS — Z1231 Encounter for screening mammogram for malignant neoplasm of breast: Secondary | ICD-10-CM | POA: Diagnosis not present

## 2023-02-03 DIAGNOSIS — Z0000 Encounter for general adult medical examination without abnormal findings: Secondary | ICD-10-CM

## 2023-02-03 DIAGNOSIS — Z780 Asymptomatic menopausal state: Secondary | ICD-10-CM

## 2023-02-05 NOTE — Progress Notes (Signed)
Normal mammogram. Follow up in 1 year.

## 2023-04-17 ENCOUNTER — Encounter: Payer: Self-pay | Admitting: Physician Assistant

## 2023-04-20 ENCOUNTER — Ambulatory Visit (INDEPENDENT_AMBULATORY_CARE_PROVIDER_SITE_OTHER): Payer: Medicare Other | Admitting: Physician Assistant

## 2023-04-20 ENCOUNTER — Encounter: Payer: Self-pay | Admitting: Physician Assistant

## 2023-04-20 VITALS — BP 121/73 | HR 41 | Resp 16 | Ht 63.0 in | Wt 143.0 lb

## 2023-04-20 DIAGNOSIS — B078 Other viral warts: Secondary | ICD-10-CM | POA: Insufficient documentation

## 2023-04-20 NOTE — Patient Instructions (Signed)
 Cryosurgery for Skin Conditions Cryosurgery is the use of a very cold liquid (liquid nitrogen) to treat skin that is not normal. This treatment is also called cryotherapy. It can also freeze or take away growths on skin, such as: Warts. Skin sores that could become cancer. Some skin cancers. This treatment usually takes a few minutes. It can be done in your doctor's office. Tell a doctor about: Any allergies you have. All medicines you are taking, including vitamins, herbs, eye drops, creams, and over-the-counter medicines. Any problems you or family members have had with medicines that numb certain areas of your body so you will not feel pain (anesthesia). Any bleeding problems you have. Any surgeries you have had. Any medical conditions you have. Whether you are pregnant or may be pregnant. What are the risks? Your doctor will talk with you about risks. These may include: Infection. Bleeding. Scars. Changes in skin color. These may include skin that is lighter or darker than it was before it was treated. Swelling. Losing hair in the treated area. Damage to nearby parts or organs, such as nerve damage and loss of feeling. This is rare. What happens before the procedure? Your doctor will talk with you about: The treatment. The benefits and risks. What happens during the procedure?  Your treatment will be done in one of these two ways: Your doctor may use a tool (probe) on your skin. The tool has very cold liquid in it to cool it down. The tool will be used until the growth is frozen and destroyed. Your doctor may use a swab or spray to get the very cold liquid onto your skin. Your doctor will keep using the very cold liquid until the growth is frozen and destroyed. The treated area may be covered with a bandage (dressing). The procedure may vary among doctors and hospitals. What happens after the procedure? You may be monitored until you leave the hospital or clinic. This includes  checking your blood pressure, heart rate, breathing rate, and blood oxygen level. You may have mild stinging or burning in the area that was treated. This information is not intended to replace advice given to you by your health care provider. Make sure you discuss any questions you have with your health care provider. Document Revised: 11/20/2021 Document Reviewed: 11/20/2021 Elsevier Patient Education  2024 [employer].

## 2023-04-20 NOTE — Progress Notes (Signed)
   Acute Office Visit  Subjective:     Patient ID: Joy Golden, female    DOB: 05/01/1955, 68 y.o.   MRN: [id number]  Chief Complaint  Patient presents with   Hand Pain    Wart on left hand and is sore    HPI Patient is in today to have wart removed from left dorsal wrist. Has been present for years but getting bigger and getting caught on bracelets.        Objective:    BP 121/73   Pulse (!) 41   Resp 16   Ht 5' 3 (1.6 m)   Wt 143 lb (64.9 kg)   SpO2 99%   BMI 25.33 kg/m  BP Readings from Last 3 Encounters:  04/20/23 121/73  01/12/23 112/62  06/04/22 117/80   Wt Readings from Last 3 Encounters:  04/20/23 143 lb (64.9 kg)  01/12/23 139 lb (63 kg)  06/04/22 142 lb 0.6 oz (64.4 kg)    Cryotherapy Procedure Note  Pre-operative Diagnosis: common wart  Post-operative Diagnosis: common wart  Locations: left dorsal wrist  Indications: irritation    Procedure Details  History of allergy to iodine : no. Pacemaker? no.  Patient informed of risks (permanent scarring, infection, light or dark discoloration, bleeding, infection, weakness, numbness and recurrence of the lesion) and benefits of the procedure and verbal informed consent obtained.  The areas are treated with liquid nitrogen therapy, frozen until ice ball extended 2 mm beyond lesion, allowed to thaw, and treated again. The patient tolerated procedure well.  The patient was instructed on post-op care, warned that there may be blister formation, redness and pain. Recommend OTC analgesia as needed for pain.  Condition: Stable  Complications: none.  Plan: 1. Instructed to keep the area dry and covered for 24-48h and clean thereafter. 2. Warning signs of infection were reviewed.   3. Recommended that the patient use OTC acetaminophen  as needed for pain.    Physical Exam Common verruca of left dorsal wrist       Assessment & Plan:  Joy Golden was seen today for hand pain.  Diagnoses and all orders  for this visit:  Common wart   Cryotherapy to remove wart today Follow up as needed  Abdallah Hern, PA-C

## 2023-05-28 ENCOUNTER — Other Ambulatory Visit: Payer: Self-pay | Admitting: Physician Assistant

## 2023-05-28 DIAGNOSIS — Z780 Asymptomatic menopausal state: Secondary | ICD-10-CM

## 2023-05-28 DIAGNOSIS — Z0000 Encounter for general adult medical examination without abnormal findings: Secondary | ICD-10-CM

## 2023-06-07 ENCOUNTER — Ambulatory Visit: Payer: BLUE CROSS/BLUE SHIELD

## 2023-06-07 VITALS — Ht 64.0 in | Wt 141.0 lb

## 2023-06-07 DIAGNOSIS — Z0000 Encounter for general adult medical examination without abnormal findings: Secondary | ICD-10-CM | POA: Diagnosis not present

## 2023-06-07 NOTE — Progress Notes (Signed)
 Subjective:   Joy Golden is a 68 y.o. female who presents for Medicare Annual (Subsequent) preventive examination.  Visit Complete: Virtual I connected with  Arora Coakley on 06/07/23 by a audio enabled telemedicine application and verified that I am speaking with the correct person using two identifiers.  Patient Location: Home  Provider Location: Office/Clinic  I discussed the limitations of evaluation and management by telemedicine. The patient expressed understanding and agreed to proceed.  Vital Signs: Because this visit was a virtual/telehealth visit, some criteria may be missing or patient reported. Any vitals not documented were not able to be obtained and vitals that have been documented are patient reported.  Patient Medicare AWV questionnaire was completed by the patient on 06/01/2023; I have confirmed that all information answered by patient is correct and no changes since this date.  Cardiac Risk Factors include: advanced age (>60men, >58 women);dyslipidemia;smoking/ tobacco exposure     Objective:    Today's Vitals   06/07/23 0759  Weight: 141 lb (64 kg)  Height: 5\' 4"  (1.626 m)   Body mass index is 24.2 kg/m.     06/07/2023    8:08 AM 06/01/2022    9:00 AM 04/09/2014    3:16 PM  Advanced Directives  Does Patient Have a Medical Advance Directive? Yes Yes No  Type of Estate agent of [employer];Living will Living will   Does patient want to make changes to medical advance directive? No - Patient declined No - Patient declined   Copy of Healthcare Power of Attorney in Chart? No - copy requested    Would patient like information on creating a medical advance directive?   No - patient declined information    Current Medications (verified) Outpatient Encounter Medications as of 06/07/2023  Medication Sig   ALPRAZolam (XANAX) 0.5 MG tablet Take by mouth.   AMBULATORY NON FORMULARY MEDICATION Bacoba Monieri 1000 mg daily   b complex vitamins tablet  Take 1 tablet by mouth daily. Bio B   Cholecalciferol (VITAMIN D3) 10 MCG (400 UNIT) tablet Take by mouth.   Citicoline 500 MG CAPS Take by mouth.   Coconut Oil 1000 MG CAPS Take by mouth.   hydrOXYzine (ATARAX/VISTARIL) 10 MG tablet Take 1 tablet (10 mg total) by mouth 3 (three) times daily as needed.   levothyroxine (SYNTHROID) 88 MCG tablet TAKE 1 TABLET BY MOUTH ONCE DAILY BEFORE  BREAKFAST.   Multiple Vitamin (MULTIVITAMIN) tablet Take 1 tablet by mouth daily.   Omega-3 Fatty Acids (OMEGA-3 CF PO) Take by mouth.   Probiotic Product (PROBIOTIC PO) Take by mouth. Strengtia   rosuvastatin (CRESTOR) 10 MG tablet TAKE 1 TABLET DAILY   triamcinolone cream (KENALOG) 0.1 % Apply 1 application topically 2 (two) times daily.   [DISCONTINUED] hyoscyamine (LEVBID) 0.375 MG 12 hr tablet Take 1 tablet (0.375 mg total) by mouth every 12 (twelve) hours as needed. (Patient not taking: Reported on 06/07/2023)   No facility-administered encounter medications on file as of 06/07/2023.    Allergies (verified) Mobic [meloxicam]   History: Past Medical History:  Diagnosis Date   Allergy    Anxiety    Arthritis    Osteoarthritis   Cataract    DVT (deep venous thrombosis) (HCC)    Heart murmur    some say yes, some say no   HLD (hyperlipidemia)    pt denies 07/12/19   Hypothyroidism    Spastic colon    Past Surgical History:  Procedure Laterality Date   ABDOMINAL HYSTERECTOMY  1983   AUGMENTATION MAMMAPLASTY     BACK SURGERY  2002   L4-L5 laminectomy   BREAST ENHANCEMENT SURGERY  2005   CARPAL TUNNEL RELEASE Right 2010   CARPAL TUNNEL WITH CUBITAL TUNNEL Left    COLON SURGERY  08/11/2020   DE QUERVAIN'S RELEASE Right    ELBOW SURGERY Left 2013   FOOT SURGERY Left 08/26/2021   GANGLION CYST EXCISION Right 2012   HARDWARE REMOVAL Right    foot   HERNIA REPAIR  1970/1980   4 surgeries- abdominal   JOINT REPLACEMENT  2006   Total Right knee   PARTIAL HYSTERECTOMY  1976   REMOVAL OF IMPLANT  Right 08/21/2016   RT FOOT   REPLACEMENT TOTAL KNEE Right 2009   SACROILIAC JOINT FUSION Left 06/19/2014   Procedure: SACROILIAC JOINT FUSION;  Surgeon: Estill Bamberg, MD;  Location[facility] OR;  Service: Orthopedics;  Laterality: Left;  Left sided sacroiliac joint fusion   SPINE SURGERY  2003   L4-5 Herniated Disk surgery   TOE SURGERY Right    great toe horse stepped on it   TONSILLECTOMY  1967   TRIGGER FINGER RELEASE Right    thumb   TUBAL LIGATION     Family History  Problem Relation Age of Onset   Alzheimer's disease Mother    Diverticulitis Mother    Tuberculosis Sister    Ovarian cancer Sister    Cancer Sister    Diabetes Maternal Grandmother    Ovarian cancer Sister    Cancer Sister    Alzheimer's disease Father    Varicose Veins Father    Crohn's disease Son    Irritable bowel syndrome Son    Diabetes Granddaughter    Social History   Socioeconomic History   Marital status: Divorced    Spouse name: Not on file   Number of children: 2   Years of education: 12   Highest education level: 12th grade  Occupational History   Occupation: retired  Tobacco Use   Smoking status: Former    Current packs/day: 0.00    Types: Cigarettes    Start date: 04/07/1967    Quit date: 04/06/1981    Years since quitting: 42.1   Smokeless tobacco: Never   Tobacco comments:    Misleading question already quit 1983  Vaping Use   Vaping status: Never Used  Substance and Sexual Activity   Alcohol use: Yes    Alcohol/week: 5.0 standard drinks of alcohol    Types: 5 Glasses of wine per week   Drug use: No   Sexual activity: Not Currently  Other Topics Concern   Not on file  Social History Narrative   Lives alone. She has two children. Her daughter lives in [place]. She enjoys doing gardening and rescuing animals.    Social Drivers of Corporate investment banker Strain: Low Risk  (06/07/2023)   Overall Financial Resource Strain (CARDIA)    Difficulty of Paying Living Expenses: Not  hard at all  Food Insecurity: No Food Insecurity (06/07/2023)   Hunger Vital Sign    Worried About Running Out of Food in the Last Year: Never true    Ran Out of Food in the Last Year: Never true  Transportation Needs: No Transportation Needs (06/07/2023)   PRAPARE - Administrator, Civil Service (Medical): No    Lack of Transportation (Non-Medical): No  Physical Activity: Insufficiently Active (06/07/2023)   Exercise Vital Sign    Days of Exercise per  Week: 3 days    Minutes of Exercise per Session: 30 min  Stress: No Stress Concern Present (06/07/2023)   [employer] of Occupational Health - Occupational Stress Questionnaire    Feeling of Stress : Not at all  Recent Concern: Stress - Stress Concern Present (04/19/2023)   [employer] of Occupational Health - Occupational Stress Questionnaire    Feeling of Stress : To some extent  Social Connections: Moderately Integrated (06/07/2023)   Social Connection and Isolation Panel [NHANES]    Frequency of Communication with Friends and Family: More than three times a week    Frequency of Social Gatherings with Friends and Family: More than three times a week    Attends Religious Services: More than 4 times per year    Active Member of [employer] or Organizations: Yes    Attends Engineer, structural: More than 4 times per year    Marital Status: Divorced    Tobacco Counseling Counseling given: Not Answered Tobacco comments: Misleading question already quit 1983   Clinical Intake:  Pre-visit preparation completed: Yes  Pain : No/denies pain     BMI - recorded: 24.2 Nutritional Status: BMI of 19-24  Normal Nutritional Risks: None Diabetes: No  How often do you need to have someone help you when you read instructions, pamphlets, or other written materials from your doctor or pharmacy?: 1 - Never What is the last grade level you completed in school?: 12  Interpreter Needed?: No      Activities of Daily  Living    06/07/2023    8:00 AM 06/03/2023    9:18 AM  In your present state of health, do you have any difficulty performing the following activities:  Hearing? 0 0  Vision? 0 0  Difficulty concentrating or making decisions? 0 0  Walking or climbing stairs? 0 0  Dressing or bathing? 0 0  Doing errands, shopping? 0 0  Preparing Food and eating ? N N  Using the Toilet? N N  In the past six months, have you accidently leaked urine? N N  Do you have problems with loss of bowel control? N N  Managing your Medications? N N  Managing your Finances? N N  Housekeeping or managing your Housekeeping? N N    Patient Care Team: Nolene Ebbs as PCP - General (Family Medicine)  Indicate any recent Medical Services you may have received from other than [facility] providers in the past year (date may be approximate).     Assessment:   This is a routine wellness examination for Joy Golden.  Hearing/Vision screen No results found.   Goals Addressed             This Visit's Progress    Activity and Exercise Increased       She would like to continue to be more active.       Depression Screen    06/07/2023    8:07 AM 06/04/2022   10:02 AM 06/01/2022    9:01 AM 05/05/2022   11:27 AM 10/24/2021    9:59 AM 07/09/2020   10:40 AM 06/13/2019   10:44 AM  PHQ 2/9 Scores  PHQ - 2 Score 0 0 0 0 0 0 0  PHQ- 9 Score    0   1    Fall Risk    06/07/2023    8:08 AM 06/03/2023    9:18 AM 06/04/2022   10:02 AM 06/01/2022    9:01 AM 05/31/2022  9:43 AM  Fall Risk   Falls in the past year? 0 0 0 0 0  Number falls in past yr: 0  0 0 0  Injury with Fall? 0  0 0 0  Risk for fall due to : No Fall Risks  No Fall Risks No Fall Risks   Follow up Falls evaluation completed  Falls evaluation completed Falls evaluation completed     MEDICARE RISK AT HOME: Medicare Risk at Home Any stairs in or around the home?: No Home free of loose throw rugs in walkways, pet beds, electrical cords, etc?: No Adequate  lighting in your home to reduce risk of falls?: Yes Life alert?: No Use of a cane, walker or w/c?: No Grab bars in the bathroom?: No Shower chair or bench in shower?: No Elevated toilet seat or a handicapped toilet?: No  TIMED UP AND GO:  Was the test performed?  No    Cognitive Function:        06/07/2023    8:09 AM 06/01/2022    9:07 AM 06/13/2019   10:00 AM  6CIT Screen  What Year? 0 points 0 points 0 points  What month? 0 points 0 points 0 points  What time? 0 points 0 points 0 points  Count back from 20 0 points 0 points 0 points  Months in reverse 0 points 0 points 0 points  Repeat phrase 0 points 0 points 0 points  Total Score 0 points 0 points 0 points    Immunizations Immunization History  Administered Date(s) Administered   PFIZER(Purple Top)SARS-COV-2 Vaccination 05/03/2019, 05/24/2019, 01/18/2020, 07/24/2020   PNEUMOCOCCAL CONJUGATE-20 07/09/2020   Pfizer(Comirnaty)Fall Seasonal Vaccine 12 years and older 01/12/2022   Pneumococcal Polysaccharide-23 06/13/2019   Tdap 02/18/2015   Zoster Recombinant(Shingrix) 03/02/2022, 05/29/2022    TDAP status: Up to date  Flu Vaccine status: Declined, Education has been provided regarding the importance of this vaccine but patient still declined. Advised may receive this vaccine at local pharmacy or Health Dept. Aware to provide a copy of the vaccination record if obtained from local pharmacy or Health Dept. Verbalized acceptance and understanding.  Pneumococcal vaccine status: Up to date  Covid-19 vaccine status: Information provided on how to obtain vaccines.   Qualifies for Shingles Vaccine? Yes   Zostavax completed No   Shingrix Completed?: Yes  Screening Tests Health Maintenance  Topic Date Due   COVID-19 Vaccine (6 - 2024-25 season) 12/06/2022   INFLUENZA VACCINE  07/05/2023 (Originally 11/05/2022)   DEXA SCAN  04/19/2024 (Originally 09/27/2018)   Medicare Annual Wellness (AWV)  06/06/2024   MAMMOGRAM  02/02/2025    DTaP/Tdap/Td (2 - Td or Tdap) 02/17/2025   Colonoscopy  07/12/2026   Pneumonia Vaccine 41+ Years old  Completed   Hepatitis C Screening  Completed   Zoster Vaccines- Shingrix  Completed   HPV VACCINES  Aged Out    Health Maintenance  Health Maintenance Due  Topic Date Due   COVID-19 Vaccine (6 - 2024-25 season) 12/06/2022    Colorectal cancer screening: Type of screening: Colonoscopy. Completed 07/12/2019. Repeat every 7 years  Mammogram status: Completed 02/03/2023. Repeat every year  Bone Density status: Ordered N?A. Pt provided with contact info and advised to call to schedule appt.  Lung Cancer Screening: (Low Dose CT Chest recommended if Age 93-80 years, 20 pack-year currently smoking OR have quit w/in 15years.) does not qualify.   Lung Cancer Screening Referral: n/a  Additional Screening:  Hepatitis C Screening: does qualify; Completed 02/18/2015  Vision  Screening: Recommended annual ophthalmology exams for early detection of glaucoma and other disorders of the eye. Is the patient up to date with their annual eye exam?  Yes  Who is the provider or what is the name of the office in which the patient attends annual eye exams? [place] If pt is not established with a provider, would they like to be referred to a provider to establish care?  N/a .   Dental Screening: Recommended annual dental exams for proper oral hygiene    Community Resource Referral / Chronic Care Management: CRR required this visit?  No   CCM required this visit?  No     Plan:     I have personally reviewed and noted the following in the patient's chart:   Medical and social history Use of alcohol, tobacco or illicit drugs  Current medications and supplements including opioid prescriptions. Patient is not currently taking opioid prescriptions. Functional ability and status Nutritional status Physical activity Advanced directives List of other physicians Hospitalizations, surgeries,  and ER visits in previous 12 months Vitals Screenings to include cognitive, depression, and falls Referrals and appointments  In addition, I have reviewed and discussed with patient certain preventive protocols, quality metrics, and best practice recommendations. A written personalized care plan for preventive services as well as general preventive health recommendations were provided to patient.     Esmond Harps, CMA   06/07/2023   After Visit Summary: (MyChart) Due to this being a telephonic visit, the after visit summary with patients personalized plan was offered to patient via MyChart   Nurse Notes:    Joy Golden is a 68 y.o. female patient of Jomarie Longs, PA-C who had a Medicare Annual Wellness Visit today via telephone. Alexah is Retired and lives alone. she has 5 children. She reports that she is socially active and does interact with friends/family regularly. She is moderately physically active and enjoys gardening and rescuing animals.

## 2023-06-07 NOTE — Patient Instructions (Signed)
  Ms. Furney , Thank you for taking time to come for your Medicare Wellness Visit. I appreciate your ongoing commitment to your health goals. Please review the following plan we discussed and let me know if I can assist you in the future.   These are the goals we discussed:  Goals       Activity and Exercise Increased      She would like to continue to be more active.       Patient Stated (pt-stated)      Patient stated that she would like to continue to be healthy and stay active.         This is a list of the screening recommended for you and due dates:  Health Maintenance  Topic Date Due   COVID-19 Vaccine (6 - 2024-25 season) 12/06/2022   Flu Shot  07/05/2023*   DEXA scan (bone density measurement)  04/19/2024*   Medicare Annual Wellness Visit  06/06/2024   Mammogram  02/02/2025   DTaP/Tdap/Td vaccine (2 - Td or Tdap) 02/17/2025   Colon Cancer Screening  07/12/2026   Pneumonia Vaccine  Completed   Hepatitis C Screening  Completed   Zoster (Shingles) Vaccine  Completed   HPV Vaccine  Aged Out  *Topic was postponed. The date shown is not the original due date.

## 2023-07-21 ENCOUNTER — Encounter: Payer: Self-pay | Admitting: Physician Assistant

## 2023-07-22 NOTE — Telephone Encounter (Signed)
 Spoke with patient. States symptoms have improved this morning. She is going to hold off for now on coming to office for evaluation. She will contact us  if needed in future.

## 2023-09-06 ENCOUNTER — Other Ambulatory Visit: Payer: Self-pay | Admitting: Physician Assistant

## 2023-09-06 DIAGNOSIS — E063 Autoimmune thyroiditis: Secondary | ICD-10-CM

## 2023-09-11 ENCOUNTER — Encounter: Payer: Self-pay | Admitting: Physician Assistant

## 2023-09-11 DIAGNOSIS — W57XXXA Bitten or stung by nonvenomous insect and other nonvenomous arthropods, initial encounter: Secondary | ICD-10-CM

## 2023-09-17 MED ORDER — DOXYCYCLINE HYCLATE 100 MG PO CAPS: 100.0000 mg | ORAL_CAPSULE | Freq: Two times a day (BID) | ORAL | 0 refills | Status: DC

## 2023-10-06 ENCOUNTER — Encounter: Payer: Self-pay | Admitting: Physician Assistant

## 2023-12-01 ENCOUNTER — Telehealth: Payer: Self-pay

## 2023-12-01 DIAGNOSIS — Z780 Asymptomatic menopausal state: Secondary | ICD-10-CM

## 2023-12-01 DIAGNOSIS — E559 Vitamin D deficiency, unspecified: Secondary | ICD-10-CM

## 2023-12-01 DIAGNOSIS — E785 Hyperlipidemia, unspecified: Secondary | ICD-10-CM

## 2023-12-01 DIAGNOSIS — E063 Autoimmune thyroiditis: Secondary | ICD-10-CM

## 2023-12-01 NOTE — Telephone Encounter (Signed)
 Is this ok for patient: lipid, cmp, cbc, TSH with T4, vitamin D , B12.

## 2023-12-01 NOTE — Telephone Encounter (Signed)
 Copied from CRM [id number]. Topic: Clinical - Request for Lab/Test Order >> Dec 01, 2023 10:12 AM Kevelyn M wrote: Reason for CRM: patient would like to check her thyroid  levels and a full blood panel and to check why she is smelling a certain smell, before her appointment on September 2nd.

## 2023-12-02 NOTE — Telephone Encounter (Signed)
 Orders placed in patient chart. Will come in today for lab work to be drawn

## 2023-12-03 ENCOUNTER — Ambulatory Visit: Payer: Self-pay | Admitting: Physician Assistant

## 2023-12-03 LAB — CBC WITH DIFFERENTIAL/PLATELET
Basophils Absolute: 0.1 x10E3/uL (ref 0.0–0.2)
Basos: 1 %
EOS (ABSOLUTE): 0.3 x10E3/uL (ref 0.0–0.4)
Eos: 4 %
Hematocrit: 45.1 % (ref 34.0–46.6)
Hemoglobin: 14.3 g/dL (ref 11.1–15.9)
Immature Grans (Abs): 0 x10E3/uL (ref 0.0–0.1)
Immature Granulocytes: 0 %
Lymphocytes Absolute: 2.9 x10E3/uL (ref 0.7–3.1)
Lymphs: 40 %
MCH: 31.5 pg (ref 26.6–33.0)
MCHC: 31.7 g/dL (ref 31.5–35.7)
MCV: 99 fL — ABNORMAL HIGH (ref 79–97)
Monocytes Absolute: 0.5 x10E3/uL (ref 0.1–0.9)
Monocytes: 7 %
Neutrophils Absolute: 3.6 x10E3/uL (ref 1.4–7.0)
Neutrophils: 48 %
Platelets: 354 x10E3/uL (ref 150–450)
RBC: 4.54 x10E6/uL (ref 3.77–5.28)
RDW: 12.9 % (ref 11.7–15.4)
WBC: 7.4 x10E3/uL (ref 3.4–10.8)

## 2023-12-03 LAB — CMP14+EGFR
ALT: 23 IU/L (ref 0–32)
AST: 29 IU/L (ref 0–40)
Albumin: 4 g/dL (ref 3.9–4.9)
Alkaline Phosphatase: 90 IU/L (ref 44–121)
BUN/Creatinine Ratio: 16 (ref 12–28)
BUN: 14 mg/dL (ref 8–27)
Bilirubin Total: 0.6 mg/dL (ref 0.0–1.2)
CO2: 23 mmol/L (ref 20–29)
Calcium: 9.6 mg/dL (ref 8.7–10.3)
Chloride: 99 mmol/L (ref 96–106)
Creatinine, Ser: 0.89 mg/dL (ref 0.57–1.00)
Globulin, Total: 2.3 g/dL (ref 1.5–4.5)
Glucose: 79 mg/dL (ref 70–99)
Potassium: 4.4 mmol/L (ref 3.5–5.2)
Sodium: 139 mmol/L (ref 134–144)
Total Protein: 6.3 g/dL (ref 6.0–8.5)
eGFR: 70 mL/min/1.73 (ref >59)

## 2023-12-03 LAB — TSH+FREE T4
Free T4: 1.61 ng/dL (ref 0.82–1.77)
TSH: 1.58 u[IU]/mL (ref 0.450–4.500)

## 2023-12-03 LAB — VITAMIN B12: Vitamin B-12: 985 pg/mL (ref 232–1245)

## 2023-12-03 LAB — LIPID PANEL
Chol/HDL Ratio: 2.3 ratio (ref 0.0–4.4)
Cholesterol, Total: 173 mg/dL (ref 100–199)
HDL: 76 mg/dL (ref >39)
LDL Chol Calc (NIH): 77 mg/dL (ref 0–99)
Triglycerides: 115 mg/dL (ref 0–149)
VLDL Cholesterol Cal: 20 mg/dL (ref 5–40)

## 2023-12-03 LAB — VITAMIN D 25 HYDROXY (VIT D DEFICIENCY, FRACTURES): Vit D, 25-Hydroxy: 78.2 ng/mL (ref 30.0–100.0)

## 2023-12-03 NOTE — Progress Notes (Signed)
Labs look really good!

## 2023-12-06 ENCOUNTER — Other Ambulatory Visit: Payer: Self-pay | Admitting: Physician Assistant

## 2023-12-06 DIAGNOSIS — I700 Atherosclerosis of aorta: Secondary | ICD-10-CM

## 2023-12-06 DIAGNOSIS — E785 Hyperlipidemia, unspecified: Secondary | ICD-10-CM

## 2023-12-06 DIAGNOSIS — Z13220 Encounter for screening for lipoid disorders: Secondary | ICD-10-CM

## 2023-12-07 ENCOUNTER — Ambulatory Visit (INDEPENDENT_AMBULATORY_CARE_PROVIDER_SITE_OTHER): Admitting: Physician Assistant

## 2023-12-07 ENCOUNTER — Encounter: Payer: Self-pay | Admitting: Physician Assistant

## 2023-12-07 VITALS — BP 116/75 | HR 45 | Ht 63.0 in | Wt 132.0 lb

## 2023-12-07 DIAGNOSIS — L2082 Flexural eczema: Secondary | ICD-10-CM

## 2023-12-07 DIAGNOSIS — G894 Chronic pain syndrome: Secondary | ICD-10-CM | POA: Diagnosis not present

## 2023-12-07 DIAGNOSIS — R439 Unspecified disturbances of smell and taste: Secondary | ICD-10-CM | POA: Diagnosis not present

## 2023-12-07 DIAGNOSIS — M5136 Other intervertebral disc degeneration, lumbar region with discogenic back pain only: Secondary | ICD-10-CM

## 2023-12-07 MED ORDER — TRIAMCINOLONE ACETONIDE 0.1 % EX CREA
1.0000 | TOPICAL_CREAM | Freq: Two times a day (BID) | CUTANEOUS | 1 refills | Status: AC
Start: 2023-12-07 — End: ?

## 2023-12-07 MED ORDER — DULOXETINE HCL 30 MG PO CPEP: ORAL_CAPSULE | ORAL | 1 refills | Status: DC

## 2023-12-07 NOTE — Progress Notes (Unsigned)
 Acute Office Visit  Subjective:     Patient ID: Joy Golden, female    DOB: Jul 26, 1955, 68 y.o.   MRN: [id number]  Chief Complaint  Patient presents with   Medical Management of Chronic Issues    HPI Patient is in today for bad sulfur like smell in her nose for weeks. She thought it could be her MSM supplement she takes for pain since it is sulfur based and stopped it. The bad smell is almost 100 percent gone. She denies any sinus pressure, teeth pain, ear pain or recent URI. She goes to dentist regularly. She does have some ongoing nasal congestion and takes a bee pollen natural supplement nasal spray.   She is now worried about her pain control of her chronic low back pain.  MSM was helping significantly. She does not want to take opioids. She is getting regular massages. She is working on her core and posture. She cannot take NSAIDS due to hx of GI bleed and ulceration.   She request refill of topical steroid for as needed usage for eczema.    ROS See HPI.      Objective:    BP 116/75   Pulse (!) 45   Ht 5' 3 (1.6 m)   Wt 132 lb (59.9 kg)   SpO2 98%   BMI 23.38 kg/m  BP Readings from Last 3 Encounters:  12/07/23 116/75  04/20/23 121/73  01/12/23 112/62   Wt Readings from Last 3 Encounters:  12/07/23 132 lb (59.9 kg)  06/07/23 141 lb (64 kg)  04/20/23 143 lb (64.9 kg)      Physical Exam Constitutional:      Appearance: Normal appearance.  HENT:     Head: Normocephalic.     Comments: No sinus tenderness to palpation.     Right Ear: Tympanic membrane, ear canal and external ear normal. There is no impacted cerumen.     Left Ear: Tympanic membrane, ear canal and external ear normal. There is no impacted cerumen.     Nose:     Comments: Nasal turbinates swollen and erythematous bilaterally with evidence of left nare polyp and less patent.     Mouth/Throat:     Mouth: Mucous membranes are moist.     Pharynx: No oropharyngeal exudate or posterior oropharyngeal  erythema.     Comments: No signs of dental caries or abscess.  Eyes:     Conjunctiva/sclera: Conjunctivae normal.  Cardiovascular:     Rate and Rhythm: Normal rate and regular rhythm.  Pulmonary:     Effort: Pulmonary effort is normal.     Breath sounds: Normal breath sounds.  Neurological:     Mental Status: She is alert.  Psychiatric:        Mood and Affect: Mood normal.         Assessment & Plan:  Joy Golden was seen today for medical management of chronic issues.  Diagnoses and all orders for this visit:  Smell disturbance  Chronic pain syndrome -     DULoxetine  (CYMBALTA ) 30 MG capsule; Take one tablet daily in the morning.  Degeneration of intervertebral disc of lumbar region with discogenic back pain -     DULoxetine  (CYMBALTA ) 30 MG capsule; Take one tablet daily in the morning.  Flexural eczema -     triamcinolone  cream (KENALOG ) 0.1 %; Apply 1 Application topically 2 (two) times daily.   Smell disturbance resolved.  If comes back consider ENT referral especially with nasal polyp of the left  nare noticed.  Trial of cymbalta  for chronic pain to take once a day in the morning Discussed risk vs benefits Follow up in 3 months Triamcinolone  to use as needed.     Return in about 3 months (around 03/07/2024).  Joy Lapid, PA-C

## 2023-12-08 DIAGNOSIS — M5136 Other intervertebral disc degeneration, lumbar region with discogenic back pain only: Secondary | ICD-10-CM | POA: Insufficient documentation

## 2023-12-08 NOTE — Addendum Note (Signed)
 Addended by: ANTONIETTE VERMELL CROME on: 12/08/2023 06:25 AM   Modules accepted: Level of Service

## 2023-12-09 ENCOUNTER — Encounter: Payer: Self-pay | Admitting: Physician Assistant

## 2023-12-10 ENCOUNTER — Encounter: Payer: Self-pay | Admitting: Physician Assistant

## 2023-12-10 DIAGNOSIS — R439 Unspecified disturbances of smell and taste: Secondary | ICD-10-CM

## 2023-12-10 DIAGNOSIS — J339 Nasal polyp, unspecified: Secondary | ICD-10-CM

## 2023-12-29 ENCOUNTER — Other Ambulatory Visit: Payer: Self-pay | Admitting: Physician Assistant

## 2023-12-29 DIAGNOSIS — M5136 Other intervertebral disc degeneration, lumbar region with discogenic back pain only: Secondary | ICD-10-CM

## 2023-12-29 DIAGNOSIS — G894 Chronic pain syndrome: Secondary | ICD-10-CM

## 2024-03-06 ENCOUNTER — Other Ambulatory Visit: Payer: Self-pay | Admitting: Physician Assistant

## 2024-03-06 DIAGNOSIS — E063 Autoimmune thyroiditis: Secondary | ICD-10-CM

## 2024-06-12 ENCOUNTER — Ambulatory Visit

## 2024-06-15 ENCOUNTER — Ambulatory Visit
